# Patient Record
Sex: Female | Born: 1965
Health system: Southern US, Community
[De-identification: ages and names within clinical notes are randomized; demographics above are authoritative.]

## PROBLEM LIST (undated history)

## (undated) DIAGNOSIS — R519 Headache, unspecified: Secondary | ICD-10-CM

## (undated) DIAGNOSIS — M1990 Unspecified osteoarthritis, unspecified site: Secondary | ICD-10-CM

## (undated) DIAGNOSIS — F419 Anxiety disorder, unspecified: Secondary | ICD-10-CM

## (undated) DIAGNOSIS — R42 Dizziness and giddiness: Secondary | ICD-10-CM

## (undated) DIAGNOSIS — Z9109 Other allergy status, other than to drugs and biological substances: Secondary | ICD-10-CM

## (undated) DIAGNOSIS — M542 Cervicalgia: Secondary | ICD-10-CM

## (undated) DIAGNOSIS — N200 Calculus of kidney: Secondary | ICD-10-CM

## (undated) DIAGNOSIS — R51 Headache: Secondary | ICD-10-CM

## (undated) DIAGNOSIS — Z87442 Personal history of urinary calculi: Secondary | ICD-10-CM

## (undated) DIAGNOSIS — R011 Cardiac murmur, unspecified: Secondary | ICD-10-CM

## (undated) DIAGNOSIS — R7303 Prediabetes: Secondary | ICD-10-CM

## (undated) HISTORY — DX: Calculus of kidney: N20.0

## (undated) HISTORY — DX: Unspecified osteoarthritis, unspecified site: M19.90

## (undated) HISTORY — DX: Anxiety disorder, unspecified: F41.9

## (undated) HISTORY — PX: OTHER SURGICAL HISTORY: SHX169

## (undated) HISTORY — DX: Other allergy status, other than to drugs and biological substances: Z91.09

---

## 2004-03-18 ENCOUNTER — Ambulatory Visit: Payer: Self-pay | Admitting: Obstetrics and Gynecology

## 2004-05-24 ENCOUNTER — Ambulatory Visit: Payer: Self-pay | Admitting: Obstetrics and Gynecology

## 2005-09-26 ENCOUNTER — Ambulatory Visit: Payer: Self-pay | Admitting: Obstetrics and Gynecology

## 2005-10-11 ENCOUNTER — Ambulatory Visit: Payer: Self-pay | Admitting: Obstetrics and Gynecology

## 2006-04-13 ENCOUNTER — Ambulatory Visit: Payer: Self-pay

## 2007-03-12 ENCOUNTER — Ambulatory Visit: Payer: Self-pay | Admitting: Obstetrics and Gynecology

## 2008-04-14 ENCOUNTER — Ambulatory Visit: Payer: Self-pay | Admitting: Obstetrics and Gynecology

## 2009-04-23 ENCOUNTER — Ambulatory Visit: Payer: Self-pay | Admitting: Obstetrics and Gynecology

## 2010-04-28 ENCOUNTER — Ambulatory Visit: Payer: Self-pay | Admitting: Obstetrics and Gynecology

## 2011-03-29 ENCOUNTER — Ambulatory Visit: Payer: Self-pay

## 2011-06-06 ENCOUNTER — Ambulatory Visit: Payer: Self-pay | Admitting: Obstetrics and Gynecology

## 2012-06-11 ENCOUNTER — Ambulatory Visit: Payer: Self-pay | Admitting: Obstetrics and Gynecology

## 2013-06-20 ENCOUNTER — Ambulatory Visit: Payer: Self-pay | Admitting: Obstetrics and Gynecology

## 2014-09-03 LAB — HM PAP SMEAR

## 2014-09-14 ENCOUNTER — Other Ambulatory Visit: Payer: Self-pay

## 2014-09-14 DIAGNOSIS — Z1231 Encounter for screening mammogram for malignant neoplasm of breast: Secondary | ICD-10-CM

## 2014-10-01 ENCOUNTER — Ambulatory Visit
Admission: RE | Admit: 2014-10-01 | Discharge: 2014-10-01 | Disposition: A | Discharge status: Home / Self Care | Payer: 59 | Source: Ambulatory Visit | Attending: Obstetrics and Gynecology | Admitting: Obstetrics and Gynecology

## 2014-10-01 DIAGNOSIS — Z1231 Encounter for screening mammogram for malignant neoplasm of breast: Secondary | ICD-10-CM | POA: Diagnosis present

## 2015-05-04 DIAGNOSIS — G43909 Migraine, unspecified, not intractable, without status migrainosus: Secondary | ICD-10-CM

## 2015-05-16 DIAGNOSIS — R42 Dizziness and giddiness: Secondary | ICD-10-CM

## 2015-05-16 HISTORY — DX: Dizziness and giddiness: R42

## 2015-05-24 ENCOUNTER — Encounter: Payer: Self-pay | Admitting: Unknown Physician Specialty

## 2015-06-11 ENCOUNTER — Encounter: Payer: Self-pay | Admitting: Unknown Physician Specialty

## 2015-08-05 ENCOUNTER — Other Ambulatory Visit: Payer: Self-pay | Admitting: Obstetrics and Gynecology

## 2015-08-06 ENCOUNTER — Other Ambulatory Visit: Payer: Self-pay | Admitting: Obstetrics and Gynecology

## 2015-09-07 ENCOUNTER — Encounter: Payer: Self-pay | Admitting: Obstetrics and Gynecology

## 2015-09-07 ENCOUNTER — Ambulatory Visit (INDEPENDENT_AMBULATORY_CARE_PROVIDER_SITE_OTHER): Payer: 59 | Admitting: Obstetrics and Gynecology

## 2015-09-07 VITALS — BP 112/71 | HR 71 | Ht 66.6 in | Wt 166.4 lb

## 2015-09-07 DIAGNOSIS — Z0000 Encounter for general adult medical examination without abnormal findings: Secondary | ICD-10-CM | POA: Diagnosis not present

## 2015-09-07 DIAGNOSIS — Z1211 Encounter for screening for malignant neoplasm of colon: Secondary | ICD-10-CM | POA: Diagnosis not present

## 2015-09-07 DIAGNOSIS — Z803 Family history of malignant neoplasm of breast: Secondary | ICD-10-CM | POA: Diagnosis not present

## 2015-09-07 DIAGNOSIS — Z01419 Encounter for gynecological examination (general) (routine) without abnormal findings: Secondary | ICD-10-CM

## 2015-09-07 DIAGNOSIS — Z87440 Personal history of urinary (tract) infections: Secondary | ICD-10-CM

## 2015-09-07 MED ORDER — ESTRADIOL 0.1 MG/GM VA CREA
1.0000 | TOPICAL_CREAM | VAGINAL | Status: DC
Start: 2015-09-07 — End: 2016-09-12

## 2015-09-07 NOTE — Progress Notes (Signed)
GYN ANNUAL PREVENTATIVE CARE ENCOUNTER NOTE  Subjective:       Sydney Horne is a 50 y.o. para 49 female here for a routine annual gynecologic exam.   Cycles are still regular on a monthly basis  Gynecologic History No LMP recorded. Contraception: none Last Pap: 09/03/2014 negative/negative Last mammogram: 10/01/2014 BI-RADS 1 Family history of breast cancer: mom and sister (sister-negative BRCA 1/BRCA2) History of TVT 2005 Dr. Amalia Hailey  Obstetric History OB History  No data available    No past medical history on file.  Past Surgical History  Procedure Laterality Date  . Bladder control surgery      Current Outpatient Prescriptions on File Prior to Visit  Medication Sig Dispense Refill  . fluticasone (FLONASE) 50 MCG/ACT nasal spray Place 2 sprays into both nostrils daily.    . nitrofurantoin (MACRODANTIN) 100 MG capsule Take 1 capsule by mouth  nightly 30 capsule 1   No current facility-administered medications on file prior to visit.    Allergies  Allergen Reactions  . Zithromax [Azithromycin] Other (See Comments)    GI upset    Social History   Social History  . Marital Status: Married    Spouse Name: N/A  . Number of Children: N/A  . Years of Education: N/A   Occupational History  . Not on file.   Social History Main Topics  . Smoking status: Never Smoker   . Smokeless tobacco: Not on file  . Alcohol Use: Not on file  . Drug Use: Not on file  . Sexual Activity: Not on file   Other Topics Concern  . Not on file   Social History Narrative    Family History  Problem Relation Age of Onset  . Breast cancer Mother 88  . Cancer Mother     breast  . Breast cancer Sister 14  . Cancer Sister     breast  . Diabetes Sister   . Breast cancer Maternal Aunt   . Breast cancer Paternal Aunt   . Breast cancer Paternal Grandmother   . Cancer Father     prostate    The following portions of the patient's history were reviewed and updated as  appropriate: allergies, current medications, past family history, past medical history, past social history, past surgical history and problem list.  Review of Systems Review of Systems  Constitutional: Negative.   HENT: Negative.   Eyes: Negative.   Respiratory: Negative.   Gastrointestinal: Negative.   Genitourinary: Negative.   Musculoskeletal: Negative.   Skin: Negative.   Neurological: Negative.   Endo/Heme/Allergies: Negative.   Psychiatric/Behavioral: Negative.      Objective:   BP 112/71 mmHg  Pulse 71  Ht 5' 6.6" (1.692 m)  Wt 166 lb 6 oz (75.467 kg)  BMI 26.36 kg/m2  LMP 08/29/2015 (Exact Date) Physical Exam  Constitutional: She is oriented to person, place, and time. She appears well-developed and well-nourished.  HENT:  Head: Normocephalic and atraumatic.  Eyes: Conjunctivae and EOM are normal.  Neck: Normal range of motion. Neck supple. No thyromegaly present.  Cardiovascular: Normal rate and regular rhythm.   No murmur heard. Pulmonary/Chest: Effort normal and breath sounds normal.  Abdominal: Soft. She exhibits no distension and no mass. There is no tenderness.  Genitourinary:  External genitalia-normal BUS-normal Vagina-normal Cervix-normal Uterus-retroverted, mobile, top normal size, posterior nodularity consistent with small fibroids, nontender Adnexa-nonpalpable and nontender Rectovaginal-normal sphincter tone; no rectal masses  Musculoskeletal: Normal range of motion.  Lymphadenopathy:    She has  no cervical adenopathy.  Neurological: She is alert and oriented to person, place, and time.  Skin: Skin is warm and dry.  Psychiatric: She has a normal mood and affect.    Assessment:   Annual gynecologic examination 50 y.o. Contraception: none Normal BMI Family history breast cancer History of recurrent UTI, desires, antibiotic prophylaxis  Plan:  Pap: Pap Co Test Mammogram: Ordered Labs: Primary care to perform Routine preventative health  maintenance measures emphasized: Diet/Weight control, Tobacco Cessation and Alcohol/Drug use Calcium with vitamin D supplementation 1200 mg daily Estrace is refilled Patient is to stop Macrodantin prophylaxis for recurrent UTIs. If UTI recurs, she will call in  To have medication restarted Return to , MD   Note: This dictation was prepared with Dragon dictation along with smaller phrase technology. Any transcriptional errors that result from this process are unintentional.

## 2015-09-07 NOTE — Patient Instructions (Addendum)
1. Pap smear in 2019 2. Mammogram ordered 3. Stool guaiac  cards not given. Cologard ordered 4. Continue with healthy eating and exercise 5. Recommend calcium with vitamin D supplementation 1200 mg a day (600 mg in the morning and 600 mg in evening; Vitamin D 400-800 international units daily) 6. Return in 1 year 7. Estrace cream prescription written

## 2015-09-15 LAB — EPI PROCOLON(R), SEPTIN 9

## 2015-09-16 ENCOUNTER — Telehealth: Payer: Self-pay | Admitting: *Deleted

## 2015-09-16 NOTE — Telephone Encounter (Signed)
Patient called and stated that the nurse called her and she is returning the nurse call. She is requesting a call back . Call back number is [phone]. Thanks

## 2015-09-20 NOTE — Telephone Encounter (Signed)
Spoke with pt see lab note.  

## 2015-10-08 ENCOUNTER — Ambulatory Visit
Admission: RE | Admit: 2015-10-08 | Discharge: 2015-10-08 | Disposition: A | Discharge status: Home / Self Care | Payer: 59 | Source: Ambulatory Visit | Attending: Obstetrics and Gynecology | Admitting: Obstetrics and Gynecology

## 2015-10-08 ENCOUNTER — Other Ambulatory Visit: Payer: Self-pay | Admitting: Obstetrics and Gynecology

## 2015-10-08 DIAGNOSIS — Z1231 Encounter for screening mammogram for malignant neoplasm of breast: Secondary | ICD-10-CM | POA: Insufficient documentation

## 2015-10-08 DIAGNOSIS — Z01419 Encounter for gynecological examination (general) (routine) without abnormal findings: Secondary | ICD-10-CM

## 2016-05-02 ENCOUNTER — Encounter: Payer: Self-pay | Admitting: Family Medicine

## 2016-05-02 ENCOUNTER — Ambulatory Visit (INDEPENDENT_AMBULATORY_CARE_PROVIDER_SITE_OTHER): Payer: 59 | Admitting: Family Medicine

## 2016-05-02 VITALS — BP 107/70 | HR 65 | Temp 98.5°F | Wt 178.0 lb

## 2016-05-02 DIAGNOSIS — M545 Low back pain, unspecified: Secondary | ICD-10-CM

## 2016-05-02 LAB — UA/M W/RFLX CULTURE, ROUTINE
Bilirubin, UA: NEGATIVE
Glucose, UA: NEGATIVE
Ketones, UA: NEGATIVE
LEUKOCYTES UA: NEGATIVE
NITRITE UA: NEGATIVE
PH UA: 7 (ref 5.0–7.5)
PROTEIN UA: NEGATIVE
RBC UA: NEGATIVE
Specific Gravity, UA: 1.02 (ref 1.005–1.030)
Urobilinogen, Ur: 0.2 mg/dL (ref 0.2–1.0)

## 2016-05-02 MED ORDER — TAMSULOSIN HCL 0.4 MG PO CAPS: 0.4000 mg | ORAL_CAPSULE | Freq: Every day | ORAL | 1 refills | Status: DC

## 2016-05-02 NOTE — Patient Instructions (Signed)
Follow up as needed

## 2016-05-02 NOTE — Progress Notes (Signed)
BP 107/70 (BP Location: Left Arm, Patient Position: Sitting, Cuff Size: Large)   Pulse 65   Temp 98.5 F (36.9 C)   Wt 178 lb (80.7 kg)   LMP 03/26/2016 (Approximate)   SpO2 99%   BMI 28.21 kg/m    Subjective:    Patient ID: Sydney Horne, female    DOB: [birth year], 50 y.o.   MRN: [id number]  HPI: Sydney Horne is a 50 y.o. female  Chief Complaint  Patient presents with  . Nephrolithiasis    pt states she had low back pain that started Wednesday night and states that the pain got very intense and then subsided in about an hour    Patient presents with left sided mid back pain that started late Wednesday and intensified to severe for about an hour and a half before slowly subsiding. States the pain was dull but became sharp, causing nausea and nearly vomiting. Now feeling almost back to normal the past day or so. Using a heat pad. Went to see chiropractor to see if it was a musculoskeletal issue and everything came back normal. No urinary symptoms, bowels moving regularly, no fever, diarrhea, bloody stools.   Relevant past medical, surgical, family and social history reviewed and updated as indicated. Interim medical history since our last visit reviewed. Allergies and medications reviewed and updated.  Review of Systems  Constitutional: Negative.   HENT: Negative.   Respiratory: Negative.   Cardiovascular: Negative.   Gastrointestinal: Positive for nausea.  Genitourinary: Positive for flank pain.  Musculoskeletal: Positive for back pain.  Neurological: Negative.   Psychiatric/Behavioral: Negative.     Per HPI unless specifically indicated above     Objective:    BP 107/70 (BP Location: Left Arm, Patient Position: Sitting, Cuff Size: Large)   Pulse 65   Temp 98.5 F (36.9 C)   Wt 178 lb (80.7 kg)   LMP 03/26/2016 (Approximate)   SpO2 99%   BMI 28.21 kg/m   Wt Readings from Last 3 Encounters:  05/02/16 178 lb (80.7 kg)  09/07/15 166 lb 6 oz (75.5 kg)  01/05/14  172 lb (78 kg)    Physical Exam  Constitutional: She is oriented to person, place, and time. She appears well-developed and well-nourished.  HENT:  Head: Atraumatic.  Eyes: Conjunctivae are normal. Pupils are equal, round, and reactive to light. No scleral icterus.  Neck: Normal range of motion. Neck supple.  Cardiovascular: Normal rate, regular rhythm and normal heart sounds.   Pulmonary/Chest: Effort normal and breath sounds normal.  Abdominal: Soft. Bowel sounds are normal. She exhibits no distension. There is no tenderness.  Musculoskeletal: Normal range of motion.  No CVA tenderness  Neurological: She is alert and oriented to person, place, and time.  Skin: Skin is warm and dry.  Psychiatric: She has a normal mood and affect. Her behavior is normal.  Nursing note and vitals reviewed.     Assessment & Plan:   Problem List Items Addressed This Visit    None    Visit Diagnoses    Acute left-sided low back pain without sciatica    -  Primary   High suspicion for a passed kidney stone, will send flomax to take the next week or so and then if any recurrence happens. Tylenol or ibuprofen, lots of water.    Relevant Orders   UA/M w/rflx Culture, Routine    U/A WNL, discussed return precautions and management if recurrence of sxs.    Follow up plan:  Return if symptoms worsen or fail to improve.

## 2016-09-11 NOTE — Progress Notes (Signed)
GYN ANNUAL PREVENTATIVE CARE ENCOUNTER NOTE  Subjective:       Sydney Horne is a 51 y.o. para 7 female here for a routine annual gynecologic exam.  1. Climacteric  Sydney Horne is here for an annual exam with no acute concerns. She is still having regular cycles, LMP 4/15. States that she is having some vasomotor symptoms mostly at night where she is getting hot and having to kick off the covers. She is not currently using any form of contraception and has no desire for any. Denies fever, chills, fatigue, urinary or bowel changes. Does have some dependent leg swelling that resolves in the morning. States that her sister has just been diagnosed with colon cancer and is now ready to have a colonoscopy.      Gynecologic History LMP:08/27/2016 Contraception: none Last Pap: 09/03/2014 negative/negative Last mammogram:10/08/2015 birad 1 Family history of breast cancer: mom and sister (sister-negative BRCA 1/BRCA2) History of TVT 2005 Dr. Amalia Hailey  Obstetric History OB History  No data available    No past medical history on file.  Past Surgical History:  Procedure Laterality Date  . bladder control surgery      Current Outpatient Prescriptions on File Prior to Visit  Medication Sig Dispense Refill  . calcium citrate-vitamin D (CITRACAL+D) 315-200 MG-UNIT tablet Take 1 tablet by mouth daily.    . Cranberry (THERACRAN ONE PO) Take 1 tablet by mouth daily.    Marland Kitchen estradiol (ESTRACE VAGINAL) 0.1 MG/GM vaginal cream Place 1 Applicatorful vaginally 2 (two) times a week. 42.5 g 3  . fluticasone (FLONASE) 50 MCG/ACT nasal spray Place 2 sprays into both nostrils as needed.     . Multiple Vitamin (MULTIVITAMIN) tablet Take 1 tablet by mouth daily.    . tamsulosin (FLOMAX) 0.4 MG CAPS capsule Take 1 capsule (0.4 mg total) by mouth daily. 30 capsule 1   No current facility-administered medications on file prior to visit.     Allergies  Allergen Reactions  . Zithromax [Azithromycin] Other (See  Comments)    GI upset    Social History   Social History  . Marital status: Married    Spouse name: N/A  . Number of children: N/A  . Years of education: N/A   Occupational History  . Not on file.   Social History Main Topics  . Smoking status: Never Smoker  . Smokeless tobacco: Never Used  . Alcohol use No  . Drug use: No  . Sexual activity: Not on file   Other Topics Concern  . Not on file   Social History Narrative  . No narrative on file    Family History  Problem Relation Age of Onset  . Breast cancer Mother 23  . Cancer Mother     breast  . Cancer Father     prostate  . Breast cancer Sister 54  . Cancer Sister     breast  . Diabetes Sister   . Breast cancer Maternal Aunt   . Breast cancer Paternal Aunt   . Breast cancer Paternal Grandmother     The following portions of the patient's history were reviewed and updated as appropriate: allergies, current medications, past family history, past medical history, past social history, past surgical history and problem list.  Review of Systems Review of Systems  Constitutional: Negative.   HENT: Negative.   Eyes: Negative.   Respiratory: Negative.   Cardiovascular: Positive for leg swelling. Negative for chest pain, palpitations and claudication.  Gastrointestinal: Negative.  Genitourinary: Negative.   Musculoskeletal: Negative.   Skin: Negative.   Neurological: Positive for headaches. Negative for dizziness and focal weakness.  Endo/Heme/Allergies: Negative.      Objective:  BP 104/70   Pulse 99   Ht 5' 6.6" (1.692 m)   Wt 177 lb 4.8 oz (80.4 kg)   LMP 08/27/2016 (Exact Date)   BMI 28.10 kg/m   Physical Exam  Constitutional: She is oriented to person, place, and time. She appears well-developed and well-nourished.  HENT:  Head: Normocephalic and atraumatic.  Eyes: Conjunctivae and EOM are normal.  Neck: Normal range of motion. Neck supple. No thyromegaly present.  Cardiovascular: Normal rate  and regular rhythm.   No murmur heard. Pulmonary/Chest: Effort normal and breath sounds normal.  Abdominal: Soft. She exhibits no distension and no mass. There is no tenderness. There is no guarding.  Genitourinary:  Genitourinary Comments: External genitalia-normal BUS-normal Vagina-normal Cervix-normal Uterus-retroverted, mobile, nontender Adnexa-nonpalpable and nontender Rectovaginal-normal sphincter tone; no rectal masses  Musculoskeletal: Normal range of motion.  Lymphadenopathy:    She has no cervical adenopathy.  Neurological: She is alert and oriented to person, place, and time.  Skin: Skin is warm and dry.  Psychiatric: She has a normal mood and affect.  Breast: Bilateral fibrocystic changes, no masses not nodules, no axillary adenopathy  Assessment:   Annual gynecologic examination 51 y.o. Contraception: none  Climacteric Normal BMI-28 Family history breast cancer History of recurrent UTI  Plan:  Pap: due 2019 Mammogram: Ordered  Colonoscopy ordered Labs: Primary care to perform Routine preventative health maintenance measures emphasized: Diet/Weight control, Tobacco Cessation and Alcohol/Drug use Calcium with vitamin D supplementation 1200 mg daily Estrace is refilled Return to [place], CMA  Aliene Altes, PA-S Brayton Mars, MD   I have seen, interviewed, and examined the patient in conjunction with the [email or web address]. student and affirm the diagnosis and management plan. Declynn Lopresti A. Treyshawn Muldrew, MD, FACOG   Note: This dictation was prepared with Dragon dictation along with smaller phrase technology. Any transcriptional errors that result from this process are unintentional.

## 2016-09-12 ENCOUNTER — Encounter: Payer: Self-pay | Admitting: Obstetrics and Gynecology

## 2016-09-12 ENCOUNTER — Ambulatory Visit (INDEPENDENT_AMBULATORY_CARE_PROVIDER_SITE_OTHER): Payer: 59 | Admitting: Obstetrics and Gynecology

## 2016-09-12 VITALS — BP 104/70 | HR 99 | Ht 66.6 in | Wt 177.3 lb

## 2016-09-12 DIAGNOSIS — Z803 Family history of malignant neoplasm of breast: Secondary | ICD-10-CM | POA: Diagnosis not present

## 2016-09-12 DIAGNOSIS — N951 Menopausal and female climacteric states: Secondary | ICD-10-CM

## 2016-09-12 DIAGNOSIS — Z01419 Encounter for gynecological examination (general) (routine) without abnormal findings: Secondary | ICD-10-CM | POA: Diagnosis not present

## 2016-09-12 DIAGNOSIS — Z1211 Encounter for screening for malignant neoplasm of colon: Secondary | ICD-10-CM | POA: Diagnosis not present

## 2016-09-12 DIAGNOSIS — Z800 Family history of malignant neoplasm of digestive organs: Secondary | ICD-10-CM | POA: Diagnosis not present

## 2016-09-12 MED ORDER — ESTRADIOL 0.1 MG/GM VA CREA: 1.0000 | TOPICAL_CREAM | VAGINAL | 3 refills | Status: DC

## 2016-09-12 NOTE — Addendum Note (Signed)
Addended by: Elouise Munroe on: 09/12/2016 04:56 PM   Modules accepted: Orders

## 2016-09-12 NOTE — Patient Instructions (Signed)
Health Maintenance, Female Adopting a healthy lifestyle and getting preventive care can go a long way to promote health and wellness. Talk with your health care provider about what schedule of regular examinations is right for you. This is a good chance for you to check in with your provider about disease prevention and staying healthy. In between checkups, there are plenty of things you can do on your own. Experts have done a lot of research about which lifestyle changes and preventive measures are most likely to keep you healthy. Ask your health care provider for more information. Weight and diet Eat a healthy diet  Be sure to include plenty of vegetables, fruits, low-fat dairy products, and lean protein.  Do not eat a lot of foods high in solid fats, added sugars, or salt.  Get regular exercise. This is one of the most important things you can do for your health.  Most adults should exercise for at least 150 minutes each week. The exercise should increase your heart rate and make you sweat (moderate-intensity exercise).  Most adults should also do strengthening exercises at least twice a week. This is in addition to the moderate-intensity exercise. Maintain a healthy weight  Body mass index (BMI) is a measurement that can be used to identify possible weight problems. It estimates body fat based on height and weight. Your health care provider can help determine your BMI and help you achieve or maintain a healthy weight.  For females 76 years of age and older:  A BMI below 18.5 is considered underweight.  A BMI of 18.5 to 24.9 is normal.  A BMI of 25 to 29.9 is considered overweight.  A BMI of 30 and above is considered obese. Watch levels of cholesterol and blood lipids  You should start having your blood tested for lipids and cholesterol at 52 years of age, then have this test every 5 years.  You may need to have your cholesterol levels checked more often if:  Your lipid or  cholesterol levels are high.  You are older than 52 years of age.  You are at high risk for heart disease. Cancer screening Lung Cancer  Lung cancer screening is recommended for adults 64-42 years old who are at high risk for lung cancer because of a history of smoking.  A yearly low-dose CT scan of the lungs is recommended for people who:  Currently smoke.  Have quit within the past 15 years.  Have at least a 30-pack-year history of smoking. A pack year is smoking an average of one pack of cigarettes a day for 1 year.  Yearly screening should continue until it has been 15 years since you quit.  Yearly screening should stop if you develop a health problem that would prevent you from having lung cancer treatment. Breast Cancer  Practice breast self-awareness. This means understanding how your breasts normally appear and feel.  It also means doing regular breast self-exams. Let your health care provider know about any changes, no matter how small.  If you are in your 20s or 30s, you should have a clinical breast exam (CBE) by a health care provider every 1-3 years as part of a regular health exam.  If you are 34 or older, have a CBE every year. Also consider having a breast X-ray (mammogram) every year.  If you have a family history of breast cancer, talk to your health care provider about genetic screening.  If you are at high risk for breast cancer, talk  to your health care provider about having an MRI and a mammogram every year.  Breast cancer gene (BRCA) assessment is recommended for women who have family members with BRCA-related cancers. BRCA-related cancers include:  Breast.  Ovarian.  Tubal.  Peritoneal cancers.  Results of the assessment will determine the need for genetic counseling and BRCA1 and BRCA2 testing. Cervical Cancer  Your health care provider may recommend that you be screened regularly for cancer of the pelvic organs (ovaries, uterus, and vagina).  This screening involves a pelvic examination, including checking for microscopic changes to the surface of your cervix (Pap test). You may be encouraged to have this screening done every 3 years, beginning at age 24.  For women ages 66-65, health care providers may recommend pelvic exams and Pap testing every 3 years, or they may recommend the Pap and pelvic exam, combined with testing for human papilloma virus (HPV), every 5 years. Some types of HPV increase your risk of cervical cancer. Testing for HPV may also be done on women of any age with unclear Pap test results.  Other health care providers may not recommend any screening for nonpregnant women who are considered low risk for pelvic cancer and who do not have symptoms. Ask your health care provider if a screening pelvic exam is right for you.  If you have had past treatment for cervical cancer or a condition that could lead to cancer, you need Pap tests and screening for cancer for at least 20 years after your treatment. If Pap tests have been discontinued, your risk factors (such as having a new sexual partner) need to be reassessed to determine if screening should resume. Some women have medical problems that increase the chance of getting cervical cancer. In these cases, your health care provider may recommend more frequent screening and Pap tests. Colorectal Cancer  This type of cancer can be detected and often prevented.  Routine colorectal cancer screening usually begins at 52 years of age and continues through 52 years of age.  Your health care provider may recommend screening at an earlier age if you have risk factors for colon cancer.  Your health care provider may also recommend using home test kits to check for hidden blood in the stool.  A small camera at the end of a tube can be used to examine your colon directly (sigmoidoscopy or colonoscopy). This is done to check for the earliest forms of colorectal cancer.  Routine  screening usually begins at age 41.  Direct examination of the colon should be repeated every 5-10 years through 52 years of age. However, you may need to be screened more often if early forms of precancerous polyps or small growths are found. Skin Cancer  Check your skin from head to toe regularly.  Tell your health care provider about any new moles or changes in moles, especially if there is a change in a mole's shape or color.  Also tell your health care provider if you have a mole that is larger than the size of a pencil eraser.  Always use sunscreen. Apply sunscreen liberally and repeatedly throughout the day.  Protect yourself by wearing long sleeves, pants, a wide-brimmed hat, and sunglasses whenever you are outside. Heart disease, diabetes, and high blood pressure  High blood pressure causes heart disease and increases the risk of stroke. High blood pressure is more likely to develop in:  People who have blood pressure in the high end of the normal range (130-139/85-89 mm Hg).  People who are overweight or obese.  People who are African American.  If you are 59-24 years of age, have your blood pressure checked every 3-5 years. If you are 34 years of age or older, have your blood pressure checked every year. You should have your blood pressure measured twice-once when you are at a hospital or clinic, and once when you are not at a hospital or clinic. Record the average of the two measurements. To check your blood pressure when you are not at a hospital or clinic, you can use:  An automated blood pressure machine at a pharmacy.  A home blood pressure monitor.  If you are between 29 years and 60 years old, ask your health care provider if you should take aspirin to prevent strokes.  Have regular diabetes screenings. This involves taking a blood sample to check your fasting blood sugar level.  If you are at a normal weight and have a low risk for diabetes, have this test once  every three years after 52 years of age.  If you are overweight and have a high risk for diabetes, consider being tested at a younger age or more often. Preventing infection Hepatitis B  If you have a higher risk for hepatitis B, you should be screened for this virus. You are considered at high risk for hepatitis B if:  You were born in a country where hepatitis B is common. Ask your health care provider which countries are considered high risk.  Your parents were born in a high-risk country, and you have not been immunized against hepatitis B (hepatitis B vaccine).  You have HIV or AIDS.  You use needles to inject street drugs.  You live with someone who has hepatitis B.  You have had sex with someone who has hepatitis B.  You get hemodialysis treatment.  You take certain medicines for conditions, including cancer, organ transplantation, and autoimmune conditions. Hepatitis C  Blood testing is recommended for:  Everyone born from [birth year] through 1966.  Anyone with known risk factors for hepatitis C. Sexually transmitted infections (STIs)  You should be screened for sexually transmitted infections (STIs) including gonorrhea and chlamydia if:  You are sexually active and are younger than 52 years of age.  You are older than 52 years of age and your health care provider tells you that you are at risk for this type of infection.  Your sexual activity has changed since you were last screened and you are at an increased risk for chlamydia or gonorrhea. Ask your health care provider if you are at risk.  If you do not have HIV, but are at risk, it may be recommended that you take a prescription medicine daily to prevent HIV infection. This is called pre-exposure prophylaxis (PrEP). You are considered at risk if:  You are sexually active and do not regularly use condoms or know the HIV status of your partner(s).  You take drugs by injection.  You are sexually active with a partner  who has HIV. Talk with your health care provider about whether you are at high risk of being infected with HIV. If you choose to begin PrEP, you should first be tested for HIV. You should then be tested every 3 months for as long as you are taking PrEP. Pregnancy  If you are premenopausal and you may become pregnant, ask your health care provider about preconception counseling.  If you may become pregnant, take 400 to 800 micrograms (mcg) of folic acid  every day.  If you want to prevent pregnancy, talk to your health care provider about birth control (contraception). Osteoporosis and menopause  Osteoporosis is a disease in which the bones lose minerals and strength with aging. This can result in serious bone fractures. Your risk for osteoporosis can be identified using a bone density scan.  If you are 4 years of age or older, or if you are at risk for osteoporosis and fractures, ask your health care provider if you should be screened.  Ask your health care provider whether you should take a calcium or vitamin D supplement to lower your risk for osteoporosis.  Menopause may have certain physical symptoms and risks.  Hormone replacement therapy may reduce some of these symptoms and risks. Talk to your health care provider about whether hormone replacement therapy is right for you. Follow these instructions at home:  Schedule regular health, dental, and eye exams.  Stay current with your immunizations.  Do not use any tobacco products including cigarettes, chewing tobacco, or electronic cigarettes.  If you are pregnant, do not drink alcohol.  If you are breastfeeding, limit how much and how often you drink alcohol.  Limit alcohol intake to no more than 1 drink per day for nonpregnant women. One drink equals 12 ounces of beer, 5 ounces of wine, or 1 ounces of hard liquor.  Do not use street drugs.  Do not share needles.  Ask your health care provider for help if you need support  or information about quitting drugs.  Tell your health care provider if you often feel depressed.  Tell your health care provider if you have ever been abused or do not feel safe at home. This information is not intended to replace advice given to you by your health care provider. Make sure you discuss any questions you have with your health care provider. Document Released: 11/14/2010 Document Revised: 10/07/2015 Document Reviewed: 02/02/2015 Elsevier Interactive Patient Education  2017 [employer].

## 2016-09-15 ENCOUNTER — Telehealth: Payer: Self-pay | Admitting: Obstetrics and Gynecology

## 2016-09-15 NOTE — Telephone Encounter (Signed)
Can you please enter a referral for patient for screening colonoscopy.

## 2016-09-18 NOTE — Addendum Note (Signed)
Addended by: Elouise Munroe on: 09/18/2016 09:44 AM   Modules accepted: Orders

## 2016-09-18 NOTE — Telephone Encounter (Signed)
Pt aware per vm referral placed.

## 2016-10-11 ENCOUNTER — Ambulatory Visit
Admission: RE | Admit: 2016-10-11 | Discharge: 2016-10-11 | Disposition: A | Discharge status: Home / Self Care | Payer: 59 | Source: Ambulatory Visit | Attending: Obstetrics and Gynecology | Admitting: Obstetrics and Gynecology

## 2016-10-11 DIAGNOSIS — Z01419 Encounter for gynecological examination (general) (routine) without abnormal findings: Secondary | ICD-10-CM

## 2016-10-11 DIAGNOSIS — Z1231 Encounter for screening mammogram for malignant neoplasm of breast: Secondary | ICD-10-CM | POA: Diagnosis not present

## 2016-10-13 ENCOUNTER — Other Ambulatory Visit: Payer: Self-pay

## 2016-10-13 ENCOUNTER — Other Ambulatory Visit: Payer: Self-pay | Admitting: Obstetrics and Gynecology

## 2016-10-13 ENCOUNTER — Telehealth: Payer: Self-pay | Admitting: Gastroenterology

## 2016-10-13 DIAGNOSIS — R928 Other abnormal and inconclusive findings on diagnostic imaging of breast: Secondary | ICD-10-CM

## 2016-10-13 DIAGNOSIS — N6489 Other specified disorders of breast: Secondary | ICD-10-CM

## 2016-10-13 DIAGNOSIS — Z1211 Encounter for screening for malignant neoplasm of colon: Secondary | ICD-10-CM

## 2016-10-13 NOTE — Telephone Encounter (Signed)
Gastroenterology Pre-Procedure Review  Request Date: 11/27/16 Requesting Physician: Dr. Allen Norris  PATIENT REVIEW QUESTIONS: The patient responded to the following health history questions as indicated:    1. Are you having any GI issues? no 2. Do you have a personal history of Polyps? no 3. Do you have a family history of Colon Cancer or Polyps? yes (sister polyps) 4. Diabetes Mellitus? no 5. Joint replacements in the past 12 months?no 6. Major health problems in the past 3 months?no 7. Any artificial heart valves, MVP, or defibrillator?no    MEDICATIONS & ALLERGIES:    Patient reports the following regarding taking any anticoagulation/antiplatelet therapy:   Plavix, Coumadin, Eliquis, Xarelto, Lovenox, Pradaxa, Brilinta, or Effient? no Aspirin? no  Patient confirms/reports the following medications:  Current Outpatient Prescriptions  Medication Sig Dispense Refill  . calcium citrate-vitamin D (CITRACAL+D) 315-200 MG-UNIT tablet Take 1 tablet by mouth daily.    . Cranberry (THERACRAN ONE PO) Take 1 tablet by mouth daily.    Marland Kitchen estradiol (ESTRACE VAGINAL) 0.1 MG/GM vaginal cream Place 1 Applicatorful vaginally 2 (two) times a week. 42.5 g 3  . fluticasone (FLONASE) 50 MCG/ACT nasal spray Place 2 sprays into both nostrils as needed.     . Multiple Vitamin (MULTIVITAMIN) tablet Take 1 tablet by mouth daily.    . tamsulosin (FLOMAX) 0.4 MG CAPS capsule Take 1 capsule (0.4 mg total) by mouth daily. 30 capsule 1   No current facility-administered medications for this visit.     Patient confirms/reports the following allergies:  Allergies  Allergen Reactions  . Zithromax [Azithromycin] Other (See Comments)    GI upset    No orders of the defined types were placed in this encounter.   AUTHORIZATION INFORMATION Primary Insurance: 1D#: Group #:  Secondary Insurance: 1D#: Group #:  SCHEDULE INFORMATION: Date: 11/27/16 Time: Location:[facility]

## 2016-10-13 NOTE — Telephone Encounter (Signed)
colonoscopy

## 2016-10-24 ENCOUNTER — Ambulatory Visit
Admission: RE | Admit: 2016-10-24 | Discharge: 2016-10-24 | Disposition: A | Discharge status: Home / Self Care | Payer: 59 | Source: Ambulatory Visit | Attending: Obstetrics and Gynecology | Admitting: Obstetrics and Gynecology

## 2016-10-24 DIAGNOSIS — N6002 Solitary cyst of left breast: Secondary | ICD-10-CM | POA: Insufficient documentation

## 2016-10-24 DIAGNOSIS — N6489 Other specified disorders of breast: Secondary | ICD-10-CM

## 2016-10-24 DIAGNOSIS — R922 Inconclusive mammogram: Secondary | ICD-10-CM | POA: Diagnosis not present

## 2016-10-24 DIAGNOSIS — R928 Other abnormal and inconclusive findings on diagnostic imaging of breast: Secondary | ICD-10-CM | POA: Diagnosis not present

## 2016-11-21 ENCOUNTER — Encounter: Payer: Self-pay | Admitting: *Deleted

## 2016-11-27 ENCOUNTER — Ambulatory Visit: Payer: 59 | Admitting: Anesthesiology

## 2016-11-27 ENCOUNTER — Ambulatory Visit
Admission: RE | Admit: 2016-11-27 | Discharge: 2016-11-27 | Disposition: A | Discharge status: Home / Self Care | Payer: 59 | Source: Ambulatory Visit | Attending: Gastroenterology | Admitting: Gastroenterology

## 2016-11-27 ENCOUNTER — Encounter
Admission: RE | Disposition: A | Discharge status: Home / Self Care | Payer: Self-pay | Source: Ambulatory Visit | Attending: Gastroenterology

## 2016-11-27 DIAGNOSIS — Z7952 Long term (current) use of systemic steroids: Secondary | ICD-10-CM | POA: Insufficient documentation

## 2016-11-27 DIAGNOSIS — Z7983 Long term (current) use of bisphosphonates: Secondary | ICD-10-CM | POA: Insufficient documentation

## 2016-11-27 DIAGNOSIS — Z1211 Encounter for screening for malignant neoplasm of colon: Secondary | ICD-10-CM | POA: Diagnosis not present

## 2016-11-27 DIAGNOSIS — Z79899 Other long term (current) drug therapy: Secondary | ICD-10-CM | POA: Insufficient documentation

## 2016-11-27 HISTORY — DX: Headache: R51

## 2016-11-27 HISTORY — DX: Cardiac murmur, unspecified: R01.1

## 2016-11-27 HISTORY — DX: Dizziness and giddiness: R42

## 2016-11-27 HISTORY — PX: COLONOSCOPY WITH PROPOFOL: SHX5780

## 2016-11-27 HISTORY — DX: Headache, unspecified: R51.9

## 2016-11-27 HISTORY — DX: Cervicalgia: M54.2

## 2016-11-27 SURGERY — COLONOSCOPY WITH PROPOFOL
Anesthesia: General

## 2016-11-27 MED ORDER — ACETAMINOPHEN 325 MG PO TABS: 325.0000 mg | ORAL_TABLET | ORAL | Status: DC | PRN

## 2016-11-27 MED ORDER — PROPOFOL 10 MG/ML IV BOLUS
INTRAVENOUS | Status: DC | PRN
  Administered 2016-11-27: 40 mg via INTRAVENOUS
  Administered 2016-11-27: 30 mg via INTRAVENOUS
  Administered 2016-11-27: 40 mg via INTRAVENOUS
  Administered 2016-11-27: 20 mg via INTRAVENOUS
  Administered 2016-11-27: 60 mg via INTRAVENOUS

## 2016-11-27 MED ORDER — ACETAMINOPHEN 160 MG/5ML PO SOLN: 325.0000 mg | ORAL | Status: DC | PRN

## 2016-11-27 MED ORDER — SIMETHICONE 40 MG/0.6ML PO SUSP
ORAL | Status: DC | PRN
  Administered 2016-11-27: 08:00:00

## 2016-11-27 MED ORDER — LACTATED RINGERS IV SOLN
INTRAVENOUS | Status: DC
  Administered 2016-11-27: 08:00:00 via INTRAVENOUS

## 2016-11-27 SURGICAL SUPPLY — 23 items

## 2016-11-27 NOTE — Anesthesia Procedure Notes (Signed)
Performed by: Melonie Florida A Pre-anesthesia Checklist: Patient identified, Patient being monitored, Emergency Drugs available, Timeout performed and Suction available Patient Re-evaluated:Patient Re-evaluated prior to induction Oxygen Delivery Method: Nasal cannula Preoxygenation: Pre-oxygenation with 100% oxygen Induction Type: IV induction Dental Injury: Teeth and Oropharynx as per pre-operative assessment

## 2016-11-27 NOTE — H&P (Signed)
Sydney Lame, MD [facility] [address], [place], [place] [address] Phone: [phone] Fax : [phone]  Primary Care Physician:  Kathrine Haddock, NP Primary Gastroenterologist:  Dr. Allen Norris  Pre-Procedure History & Physical: HPI:  Sydney Horne is a 51 y.o. female is here for a screening colonoscopy.   Past Medical History:  Diagnosis Date  . Environmental allergies   . Headache    2x/mo  . Heart murmur    followed by PCP  . Kidney stones   . Neck pain    sees chiropractor for adjustment when having problems  . Vertigo 2017   2 episodes    Past Surgical History:  Procedure Laterality Date  . bladder control surgery      Prior to Admission medications   Medication Sig Start Date End Date Taking? Authorizing Provider  calcium citrate-vitamin D (CITRACAL+D) 315-200 MG-UNIT tablet Take 1 tablet by mouth daily.   Yes [provider]  Cranberry (THERACRAN ONE PO) Take 1 tablet by mouth daily.   Yes [provider]  estradiol (ESTRACE VAGINAL) 0.1 MG/GM vaginal cream Place 1 Applicatorful vaginally 2 (two) times a week. 09/14/16  Yes Defrancesco, Alanda Slim, MD  fluticasone (FLONASE) 50 MCG/ACT nasal spray Place 2 sprays into both nostrils as needed.    Yes [provider]  ibuprofen (ADVIL,MOTRIN) 200 MG tablet Take 200 mg by mouth every 6 (six) hours as needed for headache.   Yes [provider]  Multiple Vitamin (MULTIVITAMIN) tablet Take 1 tablet by mouth daily.   Yes [provider]  tamsulosin (FLOMAX) 0.4 MG CAPS capsule Take 1 capsule (0.4 mg total) by mouth daily. Patient not taking: Reported on 11/21/2016 05/02/16   Volney American, PA-C    Allergies as of 10/13/2016 - Review Complete 09/12/2016  Allergen Reaction Noted  . Zithromax [azithromycin] Other (See Comments) 05/04/2015    Family History  Problem Relation Age of Onset  . Breast cancer Mother 62  . Cancer Mother        breast  . Cancer Father    prostate  . Breast cancer Sister 32  . Cancer Sister        breast  . Diabetes Sister   . Colon cancer Sister   . Breast cancer Maternal Aunt   . Breast cancer Paternal Aunt   . Breast cancer Paternal Grandmother   . Ovarian cancer Neg Hx     Social History   Social History  . Marital status: Married    Spouse name: N/A  . Number of children: N/A  . Years of education: N/A   Occupational History  . Not on file.   Social History Main Topics  . Smoking status: Never Smoker  . Smokeless tobacco: Never Used  . Alcohol use 0.0 oz/week     Comment: 1 drink/mo  . Drug use: No  . Sexual activity: Yes    Birth control/ protection: None   Other Topics Concern  . Not on file   Social History Narrative  . No narrative on file    Review of Systems: See HPI, otherwise negative ROS  Physical Exam: BP 118/74   Pulse 73   Temp 97.6 F (36.4 C)   Ht 5' 6.6" (1.692 m)   Wt 176 lb (79.8 kg)   LMP 11/19/2016 (Exact Date)   SpO2 98%   BMI 27.90 kg/m  General:   Alert,  pleasant and cooperative in NAD Head:  Normocephalic and atraumatic. Neck:  Supple;  no masses or thyromegaly. Lungs:  Clear throughout to auscultation.    Heart:  Regular rate and rhythm. Abdomen:  Soft, nontender and nondistended. Normal bowel sounds, without guarding, and without rebound.   Neurologic:  Alert and  oriented x4;  grossly normal neurologically.  Impression/Plan: Sydney Horne is now here to undergo a screening colonoscopy.  Risks, benefits, and alternatives regarding colonoscopy have been reviewed with the patient.  Questions have been answered.  All parties agreeable.

## 2016-11-27 NOTE — Discharge Instructions (Signed)
General Anesthesia, Adult, Care After °These instructions provide you with information about caring for yourself after your procedure. Your health care provider may also give you more specific instructions. Your treatment has been planned according to current medical practices, but problems sometimes occur. Call your health care provider if you have any problems or questions after your procedure. °What can I expect after the procedure? °After the procedure, it is common to have: °· Vomiting. °· A sore throat. °· Mental slowness. ° °It is common to feel: °· Nauseous. °· Cold or shivery. °· Sleepy. °· Tired. °· Sore or achy, even in parts of your body where you did not have surgery. ° °Follow these instructions at home: °For at least 24 hours after the procedure: °· Do not: °? Participate in activities where you could fall or become injured. °? Drive. °? Use heavy machinery. °? Drink alcohol. °? Take sleeping pills or medicines that cause drowsiness. °? Make important decisions or sign legal documents. °? Take care of children on your own. °· Rest. °Eating and drinking °· If you vomit, drink water, juice, or soup when you can drink without vomiting. °· Drink enough fluid to keep your urine clear or pale yellow. °· Make sure you have little or no nausea before eating solid foods. °· Follow the diet recommended by your health care provider. °General instructions °· Have a responsible adult stay with you until you are awake and alert. °· Return to your normal activities as told by your health care provider. Ask your health care provider what activities are safe for you. °· Take over-the-counter and prescription medicines only as told by your health care provider. °· If you smoke, do not smoke without supervision. °· Keep all follow-up visits as told by your health care provider. This is important. °Contact a health care provider if: °· You continue to have nausea or vomiting at home, and medicines are not helpful. °· You  cannot drink fluids or start eating again. °· You cannot urinate after 8-12 hours. °· You develop a skin rash. °· You have fever. °· You have increasing redness at the site of your procedure. °Get help right away if: °· You have difficulty breathing. °· You have chest pain. °· You have unexpected bleeding. °· You feel that you are having a life-threatening or urgent problem. °This information is not intended to replace advice given to you by your health care provider. Make sure you discuss any questions you have with your health care provider. °Document Released: 08/07/2000 Document Revised: 10/04/2015 Document Reviewed: 04/15/2015 °Elsevier Interactive Patient Education © 2018 [employer] Inc[employer] ° °

## 2016-11-27 NOTE — Transfer of Care (Signed)
Immediate Anesthesia Transfer of Care Note  Patient: Sydney Horne  Procedure(s) Performed: Procedure(s): COLONOSCOPY WITH PROPOFOL (N/A)  Patient Location: PACU  Anesthesia Type: General  Level of Consciousness: awake, alert  and patient cooperative  Airway and Oxygen Therapy: Patient Spontanous Breathing and Patient connected to supplemental oxygen  Post-op Assessment: Post-op Vital signs reviewed, Patient's Cardiovascular Status Stable, Respiratory Function Stable, Patent Airway and No signs of Nausea or vomiting  Post-op Vital Signs: Reviewed and stable  Complications: No apparent anesthesia complications

## 2016-11-27 NOTE — Anesthesia Procedure Notes (Signed)
Procedure Name: General with mask airway Performed by: Melonie Florida A Patient Re-evaluated:Patient Re-evaluated prior to induction Oxygen Delivery Method: Simple face mask Preoxygenation: Pre-oxygenation with 100% oxygen Induction Type: IV induction

## 2016-11-27 NOTE — Anesthesia Preprocedure Evaluation (Signed)
Anesthesia Evaluation  Patient identified by MRN, date of birth, ID band Patient awake    Reviewed: Allergy & Precautions, H&P , NPO status , Patient's Chart, lab work & pertinent test results  Airway Mallampati: II  TM Distance: >3 FB Neck ROM: full    Dental no notable dental hx.    Pulmonary    Pulmonary exam normal breath sounds clear to auscultation       Cardiovascular Normal cardiovascular exam Rhythm:regular Rate:Normal     Neuro/Psych  Headaches,    GI/Hepatic   Endo/Other    Renal/GU      Musculoskeletal   Abdominal   Peds  Hematology   Anesthesia Other Findings   Reproductive/Obstetrics                             Anesthesia Physical Anesthesia Plan  ASA: II  Anesthesia Plan: General   Post-op Pain Management:    Induction:   PONV Risk Score and Plan: 3 and Propofol  Airway Management Planned:   Additional Equipment:   Intra-op Plan:   Post-operative Plan:   Informed Consent: I have reviewed the patients History and Physical, chart, labs and discussed the procedure including the risks, benefits and alternatives for the proposed anesthesia with the patient or authorized representative who has indicated his/her understanding and acceptance.     Plan Discussed with: CRNA  Anesthesia Plan Comments:         Anesthesia Quick Evaluation

## 2016-11-27 NOTE — Anesthesia Postprocedure Evaluation (Signed)
Anesthesia Post Note  Patient: Sydney Horne  Procedure(s) Performed: Procedure(s) (LRB): COLONOSCOPY WITH PROPOFOL (N/A)  Patient location during evaluation: PACU Anesthesia Type: General Level of consciousness: awake and alert and oriented Pain management: satisfactory to patient Vital Signs Assessment: post-procedure vital signs reviewed and stable Respiratory status: spontaneous breathing, nonlabored ventilation and respiratory function stable Cardiovascular status: blood pressure returned to baseline and stable Postop Assessment: Adequate PO intake and No signs of nausea or vomiting Anesthetic complications: no    Raliegh Ip

## 2016-11-27 NOTE — Op Note (Signed)
[facility] Gastroenterology Patient Name: Sydney Horne Procedure Date: 11/27/2016 7:35 AM MRN: [id number] Account #: [id number] Date of Birth: Sep 22, 1965 Admit Type: Outpatient Age: 51 Room: [facility] OR ROOM 01 Gender: Female Note Status: Finalized Procedure:            Colonoscopy Indications:          Screening for colorectal malignant neoplasm Providers:            Lucilla Lame MD, MD Referring MD:         Kathrine Haddock (Referring MD) Medicines:            Propofol per Anesthesia Complications:        No immediate complications. Procedure:            Pre-Anesthesia Assessment:                       - Prior to the procedure, a History and Physical was                        performed, and patient medications and allergies were                        reviewed. The patient's tolerance of previous                        anesthesia was also reviewed. The risks and benefits of                        the procedure and the sedation options and risks were                        discussed with the patient. All questions were                        answered, and informed consent was obtained. Prior                        Anticoagulants: The patient has taken no previous                        anticoagulant or antiplatelet agents. ASA Grade                        Assessment: II - A patient with mild systemic disease.                        After reviewing the risks and benefits, the patient was                        deemed in satisfactory condition to undergo the                        procedure.                       After obtaining informed consent, the colonoscope was                        passed under direct vision. Throughout the procedure,  the patient's blood pressure, pulse, and oxygen                        saturations were monitored continuously. The Olympus                        CF-HQ190L Colonoscope (S#. [id number]) was introduced             through the anus and advanced to the the cecum,                        identified by appendiceal orifice and ileocecal valve.                        The colonoscopy was performed without difficulty. The                        patient tolerated the procedure well. The quality of                        the bowel preparation was excellent. Findings:      The perianal and digital rectal examinations were normal.      The colon (entire examined portion) appeared normal. Impression:           - The entire examined colon is normal.                       - No specimens collected. Recommendation:       - Discharge patient to home.                       - Resume previous diet.                       - Continue present medications.                       - Repeat colonoscopy in 10 years for screening unless                        any change in family history or lower GI problems. Procedure Code(s):    --- Professional ---                       [phone], Colonoscopy, flexible; diagnostic, including                        collection of specimen(s) by brushing or washing, when                        performed (separate procedure) Diagnosis Code(s):    --- Professional ---                       Z12.11, Encounter for screening for malignant neoplasm                        of colon CPT copyright 2016 [employer]. All rights reserved. The codes documented in this report are preliminary and upon coder review may  be revised to meet current compliance requirements. Lucilla Lame MD, MD 11/27/2016 8:09:50 AM This report has been signed electronically. Number  of Addenda: 0 Note Initiated On: 11/27/2016 7:35 AM Scope Withdrawal Time: 0 hours 8 minutes 3 seconds  Total Procedure Duration: 0 hours 12 minutes 31 seconds       [facility]

## 2016-11-28 ENCOUNTER — Encounter: Payer: Self-pay | Admitting: Gastroenterology

## 2016-12-18 LAB — LIPID PANEL
Cholesterol: 166 (ref 0–200)
HDL: 48 (ref 35–70)
LDL CALC: 93
Triglycerides: 124 (ref 40–160)

## 2016-12-18 LAB — BASIC METABOLIC PANEL: Glucose: 85

## 2016-12-18 LAB — HEMOGLOBIN A1C: HEMOGLOBIN A1C: 5.7

## 2017-01-09 ENCOUNTER — Other Ambulatory Visit: Payer: Self-pay

## 2017-01-09 MED ORDER — ESTRADIOL 0.1 MG/GM VA CREA
1.0000 | TOPICAL_CREAM | VAGINAL | 3 refills | Status: DC
Start: 2017-01-11 — End: 2017-03-19

## 2017-01-12 ENCOUNTER — Encounter: Payer: Self-pay | Admitting: Unknown Physician Specialty

## 2017-01-12 ENCOUNTER — Ambulatory Visit (INDEPENDENT_AMBULATORY_CARE_PROVIDER_SITE_OTHER): Payer: 59 | Admitting: Unknown Physician Specialty

## 2017-01-12 VITALS — BP 124/77 | HR 73 | Temp 98.9°F | Ht 66.5 in | Wt 178.6 lb

## 2017-01-12 DIAGNOSIS — S63641A Sprain of metacarpophalangeal joint of right thumb, initial encounter: Secondary | ICD-10-CM | POA: Insufficient documentation

## 2017-01-12 DIAGNOSIS — M25473 Effusion, unspecified ankle: Secondary | ICD-10-CM | POA: Diagnosis not present

## 2017-01-12 DIAGNOSIS — S5331XA Traumatic rupture of right ulnar collateral ligament, initial encounter: Secondary | ICD-10-CM

## 2017-01-12 DIAGNOSIS — E663 Overweight: Secondary | ICD-10-CM | POA: Diagnosis not present

## 2017-01-12 DIAGNOSIS — Z23 Encounter for immunization: Secondary | ICD-10-CM | POA: Diagnosis not present

## 2017-01-12 DIAGNOSIS — Z0000 Encounter for general adult medical examination without abnormal findings: Secondary | ICD-10-CM

## 2017-01-12 NOTE — Assessment & Plan Note (Signed)
Information given.  Consider referral

## 2017-01-12 NOTE — Progress Notes (Addendum)
BP 124/77   Pulse 73   Temp 98.9 F (37.2 C)   Ht 5' 6.5" (1.689 m)   Wt 178 lb 9.6 oz (81 kg)   LMP 12/13/2016 (Approximate)   SpO2 99%   BMI 28.39 kg/m    Subjective:    Patient ID: Sydney Horne, female    DOB: 01-17-1966, 51 y.o.   MRN: [id number]  HPI: Sydney Horne is a 51 y.o. female  Chief Complaint  Patient presents with  . Annual Exam   Swollen ankles.  This is new.  Noticed this in the last 3 months.  States it goes down at night.  Noticed at one time happened just during tax season while sitting a lot.  Not she notices it not in tax season and some days when she is more active.  Takes Ibuprofen 2-3 times/week when she has headaches.    Brought in biometric screening with normal lipid panel (LDL 94) GFR of 92.  Hgb A1C was 5.7  Exercise - trying to get to the gym 2 days and finding it difficult.  Doesn't have a good place to walk Diet - Focusing on getting weight under control.  Goal was to be down to 170 in August.  Working on decreasing fast food and cutting back on sugar.    Thumb laxity and decrease strength.    Social History   Social History  . Marital status: Married    Spouse name: N/A  . Number of children: N/A  . Years of education: N/A   Occupational History  . Not on file.   Social History Main Topics  . Smoking status: Never Smoker  . Smokeless tobacco: Never Used  . Alcohol use 0.0 oz/week     Comment: 1 drink/mo  . Drug use: No  . Sexual activity: Yes    Birth control/ protection: None   Other Topics Concern  . Not on file   Social History Narrative  . No narrative on file   Family History  Problem Relation Age of Onset  . Breast cancer Mother 22  . Cancer Mother        breast  . Cancer Father        prostate  . Breast cancer Sister 72  . Cancer Sister        breast  . Diabetes Sister   . Colon cancer Sister   . Breast cancer Maternal Aunt   . Breast cancer Paternal Aunt   . Breast cancer Paternal Grandmother   .  Cancer Paternal Grandmother        breast  . Diabetes Maternal Grandmother   . Heart attack Maternal Grandfather   . Diabetes Sister   . Ovarian cancer Neg Hx    Past Medical History:  Diagnosis Date  . Environmental allergies   . Headache    2x/mo  . Heart murmur    followed by PCP  . Kidney stones   . Neck pain    sees chiropractor for adjustment when having problems  . Vertigo 2017   2 episodes   Past Surgical History:  Procedure Laterality Date  . bladder control surgery    . COLONOSCOPY WITH PROPOFOL N/A 11/27/2016   Procedure: COLONOSCOPY WITH PROPOFOL;  Surgeon: Lucilla Lame, MD;  Location: [facility];  Service: Endoscopy;  Laterality: N/A;    Relevant past medical, surgical, family and social history reviewed and updated as indicated. Interim medical history since our last visit reviewed. Allergies and  medications reviewed and updated.  Review of Systems  Constitutional: Negative.   HENT: Negative.   Eyes: Negative.   Respiratory: Negative.   Cardiovascular: Negative.   Gastrointestinal: Negative.   Endocrine: Negative.   Genitourinary: Negative.   Skin: Negative.   Allergic/Immunologic: Negative.   Neurological: Negative.   Hematological: Negative.   Psychiatric/Behavioral: Negative.     Per HPI unless specifically indicated above     Objective:    BP 124/77   Pulse 73   Temp 98.9 F (37.2 C)   Ht 5' 6.5" (1.689 m)   Wt 178 lb 9.6 oz (81 kg)   LMP 12/13/2016 (Approximate)   SpO2 99%   BMI 28.39 kg/m   Wt Readings from Last 3 Encounters:  01/12/17 178 lb 9.6 oz (81 kg)  11/27/16 176 lb (79.8 kg)  09/12/16 177 lb 4.8 oz (80.4 kg)    Physical Exam  Constitutional: She is oriented to person, place, and time. She appears well-developed and well-nourished.  HENT:  Head: Normocephalic and atraumatic.  Eyes: Pupils are equal, round, and reactive to light. Right eye exhibits no discharge. Left eye exhibits no discharge. No scleral icterus.   Neck: Normal range of motion. Neck supple. Carotid bruit is not present. No thyromegaly present.  Cardiovascular: Normal rate, regular rhythm and normal heart sounds.  Exam reveals no gallop and no friction rub.   No murmur heard. Pulmonary/Chest: Effort normal and breath sounds normal. No respiratory distress. She has no wheezes. She has no rales.  Abdominal: Soft. Bowel sounds are normal. There is no tenderness. There is no rebound.  Genitourinary: No breast swelling, tenderness or discharge.  Musculoskeletal: Normal range of motion.  Thumb with laxity  Lymphadenopathy:    She has no cervical adenopathy.  Neurological: She is alert and oriented to person, place, and time.  Skin: Skin is warm, dry and intact. No rash noted.  Psychiatric: She has a normal mood and affect. Her speech is normal and behavior is normal. Judgment and thought content normal. Cognition and memory are normal.    Results for orders placed or performed in visit on 05/02/16  UA/M w/rflx Culture, Routine  Result Value Ref Range   Specific Gravity, UA 1.020 1.005 - 1.030   pH, UA 7.0 5.0 - 7.5   Color, UA Yellow Yellow   Appearance Ur Clear Clear   Leukocytes, UA Negative Negative   Protein, UA Negative Negative/Trace   Glucose, UA Negative Negative   Ketones, UA Negative Negative   RBC, UA Negative Negative   Bilirubin, UA Negative Negative   Urobilinogen, Ur 0.2 0.2 - 1.0 mg/dL   Nitrite, UA Negative Negative  -+++++++++++++++++++++++++++++++++++---------------------------------------------    Assessment & Plan:   Problem List Items Addressed This Visit      Unprioritized   Ankle swelling    Discussed diet and decreasing Ibuprofen      Relevant Orders   Comprehensive metabolic panel   CBC with Differential/Platelet   TSH   Gamekeeper's thumb of right hand    Information given.  Consider referral      Overweight    Hgb A1C is 5.7.  Discussed diet changes       Other Visit Diagnoses     Need for Tdap vaccination    -  Primary   Relevant Orders   Tdap vaccine greater than or equal to 7yo IM (Completed)   Routine general medical examination at a health care facility  Follow up plan: No Follow-up on file.

## 2017-01-12 NOTE — Assessment & Plan Note (Signed)
Hgb A1C is 5.7.  Discussed diet changes

## 2017-01-12 NOTE — Assessment & Plan Note (Signed)
Discussed diet and decreasing Ibuprofen

## 2017-01-12 NOTE — Addendum Note (Signed)
Addended by: Kathrine Haddock on: 01/12/2017 03:42 PM   Modules accepted: Level of Service

## 2017-01-12 NOTE — Patient Instructions (Addendum)
Tdap Vaccine (Tetanus, Diphtheria and Pertussis): What You Need to Know 1. Why get vaccinated? Tetanus, diphtheria and pertussis are very serious diseases. Tdap vaccine can protect us from these diseases. And, Tdap vaccine given to pregnant women can protect newborn babies against pertussis. TETANUS (Lockjaw) is rare in the [place] today. It causes painful muscle tightening and stiffness, usually all over the body.  It can lead to tightening of muscles in the head and neck so you can't open your mouth, swallow, or sometimes even breathe. Tetanus kills about 1 out of 10 people who are infected even after receiving the best medical care.  DIPHTHERIA is also rare in the [place] today. It can cause a thick coating to form in the back of the throat.  It can lead to breathing problems, heart failure, paralysis, and death.  PERTUSSIS (Whooping Cough) causes severe coughing spells, which can cause difficulty breathing, vomiting and disturbed sleep.  It can also lead to weight loss, incontinence, and rib fractures. Up to 2 in 100 adolescents and 5 in 100 adults with pertussis are hospitalized or have complications, which could include pneumonia or death.  These diseases are caused by bacteria. Diphtheria and pertussis are spread from person to person through secretions from coughing or sneezing. Tetanus enters the body through cuts, scratches, or wounds. Before vaccines, as many as 200,000 cases of diphtheria, 200,000 cases of pertussis, and hundreds of cases of tetanus, were reported in the [place] each year. Since vaccination began, reports of cases for tetanus and diphtheria have dropped by about 99% and for pertussis by about 80%. 2. Tdap vaccine Tdap vaccine can protect adolescents and adults from tetanus, diphtheria, and pertussis. One dose of Tdap is routinely given at age 11 or 12. People who did not get Tdap at that age should get it as soon as possible. Tdap is especially  important for healthcare professionals and anyone having close contact with a baby younger than 12 months. Pregnant women should get a dose of Tdap during every pregnancy, to protect the newborn from pertussis. Infants are most at risk for severe, life-threatening complications from pertussis. Another vaccine, called Td, protects against tetanus and diphtheria, but not pertussis. A Td booster should be given every 10 years. Tdap may be given as one of these boosters if you have never gotten Tdap before. Tdap may also be given after a severe cut or burn to prevent tetanus infection. Your doctor or the person giving you the vaccine can give you more information. Tdap may safely be given at the same time as other vaccines. 3. Some people should not get this vaccine  A person who has ever had a life-threatening allergic reaction after a previous dose of any diphtheria, tetanus or pertussis containing vaccine, OR has a severe allergy to any part of this vaccine, should not get Tdap vaccine. Tell the person giving the vaccine about any severe allergies.  Anyone who had coma or long repeated seizures within 7 days after a childhood dose of DTP or DTaP, or a previous dose of Tdap, should not get Tdap, unless a cause other than the vaccine was found. They can still get Td.  Talk to your doctor if you: ? have seizures or another nervous system problem, ? had severe pain or swelling after any vaccine containing diphtheria, tetanus or pertussis, ? ever had a condition called Guillain-Barr Syndrome (GBS), ? aren't feeling well on the day the shot is scheduled. 4. Risks With any medicine, including   vaccines, there is a chance of side effects. These are usually mild and go away on their own. Serious reactions are also possible but are rare. Most people who get Tdap vaccine do not have any problems with it. Mild problems following Tdap: (Did not interfere with activities)  Pain where the shot was given (about  3 in 4 adolescents or 2 in 3 adults)  Redness or swelling where the shot was given (about 1 person in 5)  Mild fever of at least 100.4F (up to about 1 in 25 adolescents or 1 in 100 adults)  Headache (about 3 or 4 people in 10)  Tiredness (about 1 person in 3 or 4)  Nausea, vomiting, diarrhea, stomach ache (up to 1 in 4 adolescents or 1 in 10 adults)  Chills, sore joints (about 1 person in 10)  Body aches (about 1 person in 3 or 4)  Rash, swollen glands (uncommon)  Moderate problems following Tdap: (Interfered with activities, but did not require medical attention)  Pain where the shot was given (up to 1 in 5 or 6)  Redness or swelling where the shot was given (up to about 1 in 16 adolescents or 1 in 12 adults)  Fever over 102F (about 1 in 100 adolescents or 1 in 250 adults)  Headache (about 1 in 7 adolescents or 1 in 10 adults)  Nausea, vomiting, diarrhea, stomach ache (up to 1 or 3 people in 100)  Swelling of the entire arm where the shot was given (up to about 1 in 500).  Severe problems following Tdap: (Unable to perform usual activities; required medical attention)  Swelling, severe pain, bleeding and redness in the arm where the shot was given (rare).  Problems that could happen after any vaccine:  People sometimes faint after a medical procedure, including vaccination. Sitting or lying down for about 15 minutes can help prevent fainting, and injuries caused by a fall. Tell your doctor if you feel dizzy, or have vision changes or ringing in the ears.  Some people get severe pain in the shoulder and have difficulty moving the arm where a shot was given. This happens very rarely.  Any medication can cause a severe allergic reaction. Such reactions from a vaccine are very rare, estimated at fewer than 1 in a million doses, and would happen within a few minutes to a few hours after the vaccination. As with any medicine, there is a very remote chance of a vaccine  causing a serious injury or death. The safety of vaccines is always being monitored. For more information, visit: www.cdc.gov/vaccinesafety/ 5. What if there is a serious problem? What should I look for? Look for anything that concerns you, such as signs of a severe allergic reaction, very high fever, or unusual behavior. Signs of a severe allergic reaction can include hives, swelling of the face and throat, difficulty breathing, a fast heartbeat, dizziness, and weakness. These would usually start a few minutes to a few hours after the vaccination. What should I do?  If you think it is a severe allergic reaction or other emergency that can't wait, call 9-1-1 or get the person to the nearest hospital. Otherwise, call your doctor.  Afterward, the reaction should be reported to the Vaccine Adverse Event Reporting System (VAERS). Your doctor might file this report, or you can do it yourself through the VAERS web site at www.vaers.hhs.gov, or by calling 1-800-822-7967. ? VAERS does not give medical advice. 6. The National Vaccine Injury Compensation Program The National   Vaccine Injury Compensation Program (VICP) is a federal program that was created to compensate people who may have been injured by certain vaccines. Persons who believe they may have been injured by a vaccine can learn about the program and about filing a claim by calling 937-245-9817 or visiting the [place] website at GoldCloset.com.ee. There is a time limit to file a claim for compensation. 7. How can I learn more?  Ask your doctor. He or she can give you the vaccine package insert or suggest other sources of information.  Call your local or state health department.  Contact the Centers for Disease Control and Prevention (CDC): ? Call 920 421 9820 (1-800-CDC-INFO) or ? Visit CDC's website at http://hunter.com/ CDC Tdap Vaccine VIS (07/08/13) ------------------------------------------------------------ Think  you're too busy to work out? We have the workout for you. In minutes, high-intensity interval training (H.I.I.T.) will have you sweating, breathing hard and maximizing the health benefits of exercise without the time commitment. Best of all, it's scientifically proven to work.  What Is H.I.I.T.? SHORT WORKOUTS 101 High-intensity interval training - referred to as H.I.I.T. - is based on the idea that short bursts of strenuous exercise can have a big impact on the body. If moderate exercise - like a 20-minute jog - is good for your heart, lungs and metabolism, H.I.I.T. packs the benefits of that workout and more into a few minutes. It may sound too good to be true, but learning this exercise technique and adapting it to your life can mean saving hours at the gym. If you think you don't have time to exercise, H.I.I.T. may be the workout for you.  You can try it with any aerobic activity you like. The principles of H.I.I.T. can be applied to running, biking, stair climbing, swimming, jumping rope, rowing, even hopping or skipping. (Yes, skipping!)  The downside? Even though H.I.I.T. lasts only minutes, the workouts are tough, requiring you to push your body near its limit.  HOW INTENSE IS HIGH INTENSITY? High-intensity exercise is obviously not a casual stroll down the street, but it's not a run-till-your-lungs-pop explosion, either. Think breathless, not winded. Heart-pounding, not exploding. Legs pumping, but not uncontrolled.  You don't need any fancy heart rate monitors to do these workouts. Use cues from your body as a guide. In the middle of a high-intensity workout you should be able to say single words, but not complete whole sentences. So, if you can keep chatting to your workout partner during this workout, pump it up a few notches.  03-03-29 Training This simple program will help you make the most of a short workout by improving heart health and endurance. Try it with your favorite  cardiovascular activity. The essentials of 03-03-29 training are simple. Run, ride or perhaps row on a rowing machine gently for 30 seconds, accelerate to a moderate pace for 20 seconds, then sprint as hard as you can for 10 seconds. (It should be called 30-20-10 training, obviously, but that is not as catchy.) Repeat.  You don't even need a stopwatch to monitor the 30-, 20-, and 10-second time changes. You can just count to yourself, which seems to make the intervals pass more quickly.  Best of all? The grueling, all-out portion of the workout lasts for only 10 seconds. C'mon, you can do anything for 10 seconds, right?  Got 10 Minutes? A solitary minute of hard work buried in 10 minutes of activity can make a big difference.  The 10-Minute Workout If you like to run, bike, row or  swim - just a little bit - this workout is a great option for you. Step 1 Warm up for 2 minutes Step 2 Pedal, run or swim all-out for 20 seconds. Repeat 2 more times Warm down for 3 minutes    GET STARTED To benefit the most from really, really short workouts, you need to build the habit of doing them into your hectic life. Ideally, you'll complete the workout three times a week. The best way to build that habit is to start small and be willing to tweak your schedule where you can to accommodate your new workout.  First set up a spot in your house for your workout, equipped with whatever you need to get the job done: sneakers, a chair, a towel, etc. Then slot your workout in before you would normally shower. (You can even do it in the bathroom.) Or wake up five minutes earlier and do it first thing in the morning, so you can head off to work feeling accomplished. Or do it during your lunch hour. Run up your office's stairs or grab a private conference room for just a few minutes. Or work it into your commute. If you walk or bike to work, add some heavy intervals on the way home.  GET A BOOST FROM MUSIC Creating a  workout playlist of high-energy tunes you love will not make your workout feel easier, but it may cause you to exercise harder without even realizing it. Best of all, if you are doing a really short workout, you need only one or two great tunes to get you through. If you are willing to try something a bit different, make your own music as you exercise. Sing, hum, clap your hands, whatever you can do to jam along to your playlist. It may give you an extra boost to finish strong.  Find a song or podcast that's the length of your really, really short workout. By the time the song is over, you're done.  Excerpted from the [place] Well column http://www.nytimes.com/well/guides/really-really-short-workouts?smid=fb-nytwell&smtyp=pay  Ulnar Collateral Ligament Injury of the Thumb A ligament is a strong band of tissue that connects and supports bones. Ulnar collateral ligament (UCL) injury happens when the UCL at the base of the thumb is stretched or torn. A tear can be either partial or complete. The severity of the injury depends on how much of the ligament was damaged or torn. The UCL ligament is important for normal use of the thumb. This ligament helps you to use and move your thumb. UCL injury can happen suddenly (acuteinjury) or gradually (chronic injury) with repeated overstretching of the ligament. If it is not treated properly, UCL injury can lead to arthritis. What are the causes? This injury is caused by forcefully moving the thumb past its normal range of motion toward the wrist. If you extend your hands to catch an object or to protect yourself while falling, the force of the impact can cause your ligament to stretch too much. This excess tension can also cause your ligament to tear. What increases the risk? This injury is more likely to occur in:  People who have had a previous thumb injury or sprain.  People who play contact sports or sports that involve catching balls, such as baseball,  basketball, or football.  People who do activities that increase the chance that the thumb will be pulled away from the rest of the hand.  People who have poor hand strength and flexibility.  People who do not  warm up properly before activities.  What are the signs or symptoms? Symptoms of this injury include:  Pain or tenderness over the injured area with movement of the thumb.  Pain when the injured area is pressed.  Bruising or redness at the base of the thumb. This can spread to the whole thumb and part of the hand.  Swelling over the injured area.  Difficulty grasping or pinching with the injured thumb due to weakness or pain.  If the injury is severe, a lump (mass) may be felt under the skin in the injured area. How is this diagnosed? This injury is diagnosed with a medical history and physical exam. You may also have imaging studies, including:  X-ray.  Ultrasound.  MRI.  How is this treated? Treatment varies depending on the severity of your injury. If the UCL is overstretched or partially torn, treatment usually involves keeping your thumb in a fixed position (immobilization) for a period of time. To help you do this, your health care provider will apply a brace, cast, or splint to keep your thumb from moving until it heals. If the UCL is fully torn, you may need surgery to reconnect the ligament to the bone. After surgery, a cast or splint will be applied and it will need to stay on your thumb while it heals. Your health care provider may also suggest exercises or physical therapy to strengthen your thumb. Follow these instructions at home: If you have a cast:  Do not stick anything inside the cast to scratch your skin. Doing that increases your risk of infection.  Check the skin around the cast every day. Report any concerns to your health care provider. You may put lotion on dry skin around the edges of the cast. Do not apply lotion to the skin underneath the  cast.  Keep the cast clean and dry. If you have a splint or brace:  Wear it as told by your health care provider. Remove it only as told by your health care provider.  Loosen it if your fingers become numb and tingle, or if they turn cold and blue.  Keep the brace or splint clean and dry. Bathing  Cover the cast or splint and bandage (dressing) with a watertight plastic bag to protect it from water while you take a bath or shower. Do not let the cast or splint and dressing get wet. Managing pain, stiffness, and swelling  If directed, apply ice to the injured area: ? Put ice in a plastic bag. ? Place a towel between your skin and the bag. ? Leave the ice on for 20 minutes, 2-3 times per day.  Move your fingers often to avoid stiffness and to lessen swelling.  Raise (elevate) the injured area above the level of your heart while you are sitting or lying down. Driving  Do not drive or operate heavy machinery while taking prescription pain medicine.  Ask your health care provider when it is safe to drive if you have a cast, splint, or brace on your hand. General instructions  Do not put pressure on any part of your cast or splint until it is fully hardened. This may take several hours.  Take over-the-counter and prescription medicines only as told by your health care provider.  Keep all follow-up visits as told by your health care provider. This is important.  Do not wear rings on your injured thumb.  Do any exercise or physical therapy as told by your health care provider. Contact  a health care provider if:  Your pain is not controlled with medicine.  Your bruising or swelling gets worse.  Your cast or splint is damaged.  Your thumb is numb or blue.  Your thumb feels colder than normal. This information is not intended to replace advice given to you by your health care provider. Make sure you discuss any questions you have with your health care provider. Document  Released: 05/01/2005 Document Revised: 01/02/2016 Document Reviewed: 07/08/2014 Elsevier Interactive Patient Education  2018 [place] thumb

## 2017-01-13 LAB — COMPREHENSIVE METABOLIC PANEL
ALT: 10 IU/L (ref 0–32)
AST: 13 IU/L (ref 0–40)
Albumin/Globulin Ratio: 2.4 — ABNORMAL HIGH (ref 1.2–2.2)
Albumin: 4.5 g/dL (ref 3.5–5.5)
Alkaline Phosphatase: 69 IU/L (ref 39–117)
BILIRUBIN TOTAL: 0.3 mg/dL (ref 0.0–1.2)
BUN / CREAT RATIO: 14 (ref 9–23)
BUN: 12 mg/dL (ref 6–24)
CHLORIDE: 104 mmol/L (ref 96–106)
CO2: 24 mmol/L (ref 20–29)
Calcium: 9.6 mg/dL (ref 8.7–10.2)
Creatinine, Ser: 0.84 mg/dL (ref 0.57–1.00)
GFR calc non Af Amer: 80 mL/min/{1.73_m2} (ref >59)
GFR, EST AFRICAN AMERICAN: 92 mL/min/{1.73_m2} (ref >59)
Globulin, Total: 1.9 g/dL (ref 1.5–4.5)
Glucose: 120 mg/dL — ABNORMAL HIGH (ref 65–99)
Potassium: 4.1 mmol/L (ref 3.5–5.2)
Sodium: 142 mmol/L (ref 134–144)
TOTAL PROTEIN: 6.4 g/dL (ref 6.0–8.5)

## 2017-01-13 LAB — CBC WITH DIFFERENTIAL/PLATELET
BASOS ABS: 0 10*3/uL (ref 0.0–0.2)
Basos: 0 %
EOS (ABSOLUTE): 0 10*3/uL (ref 0.0–0.4)
EOS: 0 %
HEMATOCRIT: 40.4 % (ref 34.0–46.6)
Hemoglobin: 13.1 g/dL (ref 11.1–15.9)
IMMATURE GRANULOCYTES: 0 %
Immature Grans (Abs): 0 10*3/uL (ref 0.0–0.1)
Lymphocytes Absolute: 1.7 10*3/uL (ref 0.7–3.1)
Lymphs: 20 %
MCH: 30.1 pg (ref 26.6–33.0)
MCHC: 32.4 g/dL (ref 31.5–35.7)
MCV: 93 fL (ref 79–97)
MONOS ABS: 0.9 10*3/uL (ref 0.1–0.9)
Monocytes: 10 %
Neutrophils Absolute: 5.8 10*3/uL (ref 1.4–7.0)
Neutrophils: 70 %
PLATELETS: 211 10*3/uL (ref 150–379)
RBC: 4.35 x10E6/uL (ref 3.77–5.28)
RDW: 13.9 % (ref 12.3–15.4)
WBC: 8.4 10*3/uL (ref 3.4–10.8)

## 2017-01-13 LAB — TSH: TSH: 1.74 u[IU]/mL (ref 0.450–4.500)

## 2017-01-16 ENCOUNTER — Encounter: Payer: Self-pay | Admitting: Unknown Physician Specialty

## 2017-03-01 ENCOUNTER — Telehealth: Payer: Self-pay | Admitting: Unknown Physician Specialty

## 2017-03-01 DIAGNOSIS — M79641 Pain in right hand: Secondary | ICD-10-CM

## 2017-03-01 NOTE — Telephone Encounter (Signed)
Referral generated

## 2017-03-01 NOTE — Telephone Encounter (Signed)
Routing to provider  

## 2017-03-01 NOTE — Telephone Encounter (Signed)
Patient states she and Malachy Mood discussed patient being referred to ortho for her hand issue and at the time she wanted to wait but now she is ready for the referral to be placed.  She said to try to refer her to someone in the [place] area if they accept her insurance.  Thanks

## 2017-03-05 DIAGNOSIS — M189 Osteoarthritis of first carpometacarpal joint, unspecified: Secondary | ICD-10-CM | POA: Diagnosis not present

## 2017-03-05 DIAGNOSIS — S66019A Strain of long flexor muscle, fascia and tendon of unspecified thumb at wrist and hand level, initial encounter: Secondary | ICD-10-CM | POA: Diagnosis not present

## 2017-03-19 ENCOUNTER — Other Ambulatory Visit: Payer: Self-pay | Admitting: Obstetrics and Gynecology

## 2017-04-03 DIAGNOSIS — S66019A Strain of long flexor muscle, fascia and tendon of unspecified thumb at wrist and hand level, initial encounter: Secondary | ICD-10-CM | POA: Diagnosis not present

## 2017-04-03 DIAGNOSIS — M1811 Unilateral primary osteoarthritis of first carpometacarpal joint, right hand: Secondary | ICD-10-CM | POA: Diagnosis not present

## 2017-04-17 DIAGNOSIS — M1811 Unilateral primary osteoarthritis of first carpometacarpal joint, right hand: Secondary | ICD-10-CM | POA: Diagnosis not present

## 2017-04-17 DIAGNOSIS — S66019A Strain of long flexor muscle, fascia and tendon of unspecified thumb at wrist and hand level, initial encounter: Secondary | ICD-10-CM | POA: Diagnosis not present

## 2017-05-01 DIAGNOSIS — S66019A Strain of long flexor muscle, fascia and tendon of unspecified thumb at wrist and hand level, initial encounter: Secondary | ICD-10-CM | POA: Diagnosis not present

## 2017-05-01 DIAGNOSIS — M1811 Unilateral primary osteoarthritis of first carpometacarpal joint, right hand: Secondary | ICD-10-CM | POA: Diagnosis not present

## 2017-05-24 DIAGNOSIS — S66019A Strain of long flexor muscle, fascia and tendon of unspecified thumb at wrist and hand level, initial encounter: Secondary | ICD-10-CM | POA: Diagnosis not present

## 2017-05-24 DIAGNOSIS — M1811 Unilateral primary osteoarthritis of first carpometacarpal joint, right hand: Secondary | ICD-10-CM | POA: Diagnosis not present

## 2017-09-10 NOTE — Progress Notes (Signed)
GYN ANNUAL PREVENTATIVE CARE ENCOUNTER NOTE  Subjective:       Sydney Horne is a 52 y.o. para 72 female here for a routine annual gynecologic exam.  1. Painful cycle and irregular cycles; patient reports significant increased central cramps and low back pain with radiation into the buttocks she has been taking 600 mg of Advil several times a day. She also notes painful bowel movements the first day of her cycle.  She does not report any UTI type or irritative bladder symptoms.  Last cycle was 6 weeks ago and she is now spotting today.  For the last year she has been having monthly menses less than 7 days.  No intermenstrual bleeding.  Colonoscopy this past year was normal.  No major interval health condoms have been identified.  Gynecologic History LMP:3 /21/2019 Contraception: none Last Pap: 09/03/2014 negative/negative Last mammogram:10/24/2016  birad 2 Family history of breast cancer: mom and sister (sister-negative BRCA 1/BRCA2) History of TVT 2005 Dr. Amalia Hailey  Obstetric History OB History  Gravida Para Term Preterm AB Living  '2 2 2     2  ' SAB TAB Ectopic Multiple Live Births          2    # Outcome Date GA Lbr Len/2nd Weight Sex Delivery Anes PTL Lv  2 Term 1999   8 lb 12.8 oz (3.992 kg) M Vag-Spont   LIV  1 Term 1997   8 lb 1.6 oz (3.674 kg) F Vag-Spont   LIV    Past Medical History:  Diagnosis Date  . Environmental allergies   . Headache    2x/mo  . Heart murmur    followed by PCP  . Kidney stones   . Neck pain    sees chiropractor for adjustment when having problems  . Vertigo 2017   2 episodes    Past Surgical History:  Procedure Laterality Date  . bladder control surgery    . COLONOSCOPY WITH PROPOFOL N/A 11/27/2016   Procedure: COLONOSCOPY WITH PROPOFOL;  Surgeon: Lucilla Lame, MD;  Location: [facility];  Service: Endoscopy;  Laterality: N/A;    Current Outpatient Medications on File Prior to Visit  Medication Sig Dispense Refill  . calcium  citrate-vitamin D (CITRACAL+D) 315-200 MG-UNIT tablet Take 1 tablet by mouth daily.    . Cranberry (THERACRAN ONE PO) Take 1 tablet by mouth daily.    Marland Kitchen estradiol (ESTRACE) 0.1 MG/GM vaginal cream INSERT 1 APPLICATORFUL  VAGINALLY TWICE WEEKLY 127.5 g 3  . fluticasone (FLONASE) 50 MCG/ACT nasal spray Place 2 sprays into both nostrils as needed.     Marland Kitchen ibuprofen (ADVIL,MOTRIN) 200 MG tablet Take 200 mg by mouth every 6 (six) hours as needed for headache.    . Multiple Vitamin (MULTIVITAMIN) tablet Take 1 tablet by mouth daily.     No current facility-administered medications on file prior to visit.     Allergies  Allergen Reactions  . Other Nausea And Vomiting    sauerkraut  . Rye Grass Flower Pollen Extract [Gramineae Pollens] Nausea And Vomiting    Rye - food product  . Zithromax [Azithromycin] Diarrhea       . Adhesive [Tape] Rash    Band-aids    Social History   Socioeconomic History  . Marital status: Married    Spouse name: Not on file  . Number of children: Not on file  . Years of education: Not on file  . Highest education level: Not on file  Occupational History  .  Not on file  Social Needs  . Financial resource strain: Not on file  . Food insecurity:    Worry: Not on file    Inability: Not on file  . Transportation needs:    Medical: Not on file    Non-medical: Not on file  Tobacco Use  . Smoking status: Never Smoker  . Smokeless tobacco: Never Used  Substance and Sexual Activity  . Alcohol use: Yes    Alcohol/week: 0.0 oz    Comment: 1 drink/mo  . Drug use: No  . Sexual activity: Yes    Birth control/protection: None  Lifestyle  . Physical activity:    Days per week: Not on file    Minutes per session: Not on file  . Stress: Not on file  Relationships  . Social connections:    Talks on phone: Not on file    Gets together: Not on file    Attends religious service: Not on file    Active member of club or organization: Not on file    Attends meetings  of clubs or organizations: Not on file    Relationship status: Not on file  . Intimate partner violence:    Fear of current or ex partner: Not on file    Emotionally abused: Not on file    Physically abused: Not on file    Forced sexual activity: Not on file  Other Topics Concern  . Not on file  Social History Narrative  . Not on file    Family History  Problem Relation Age of Onset  . Breast cancer Mother 27  . Cancer Mother        breast  . Cancer Father        prostate  . Breast cancer Sister 41  . Cancer Sister        breast  . Diabetes Sister   . Colon cancer Sister   . Breast cancer Maternal Aunt   . Breast cancer Paternal Aunt   . Breast cancer Paternal Grandmother   . Cancer Paternal Grandmother        breast  . Diabetes Maternal Grandmother   . Heart attack Maternal Grandfather   . Diabetes Sister   . Ovarian cancer Neg Hx     The following portions of the patient's history were reviewed and updated as appropriate: allergies, current medications, past family history, past medical history, past social history, past surgical history and problem list.  Review of Systems  Review of Systems  Constitutional: Negative.        No vasomotor symptoms  HENT: Negative.   Eyes: Negative.   Respiratory: Negative.   Cardiovascular: Negative.   Gastrointestinal:       Painful bowel movements first day of cycle  Genitourinary:       Severe dysmenorrhea and brown pasty discharge perimenstrually is noted  Musculoskeletal: Negative.   Skin: Negative.   Neurological: Negative.   Endo/Heme/Allergies: Negative.   Psychiatric/Behavioral: Negative.     Objective:  BP 123/65   Pulse 81   Ht 5' 6.5" (1.689 m)   Wt 183 lb 8 oz (83.2 kg)   LMP 08/02/2017 (Approximate)   BMI 29.17 kg/m  Physical Exam  Constitutional: She is oriented to person, place, and time. She appears well-developed and well-nourished.  HENT:  Head: Normocephalic and atraumatic.  Eyes: Conjunctivae  and EOM are normal.  Neck: Normal range of motion. Neck supple. No thyromegaly present.  Cardiovascular: Normal rate and regular rhythm.  No  murmur heard. Pulmonary/Chest: Effort normal and breath sounds normal.  Abdominal: Soft. She exhibits no distension and no mass. There is no tenderness. There is no guarding.  Genitourinary:  Genitourinary Comments: External genitalia-normal BUS-normal Vagina-normal; estrogen cream  in the vault along with minimal menstrual blood Cervix-normal; no lesions; no cervical motion tenderness Uterus-retroverted, mobile, 1/4 tender Adnexa-nonpalpable and nontender Rectovaginal-normal sphincter tone; no rectal masses; posterior uterus is tender on rectovaginal exam without obvious nodularity or thickening  Musculoskeletal: Normal range of motion.  Lymphadenopathy:    She has no cervical adenopathy.  Neurological: She is alert and oriented to person, place, and time.  Skin: Skin is warm and dry.  Psychiatric: She has a normal mood and affect.  Breast: Bilateral fibrocystic changes, no masses not nodules, no axillary adenopathy  Assessment:   Annual gynecologic examination 52 y.o. Contraception: none  Climacteric Normal BMI-29 Family history breast cancer History of recurrent UTI Dysmenorrhea Possible endometriosis based on clinical exam and history  Plan:  Pap: pap/hpv Mammogram: Ordered  Colonoscopy -epipro colon- 2017 neg -colonoscopy 11/2016- wnl Labs: Primary care to perform Routine preventative health maintenance measures emphasized: Diet/Weight control, Tobacco Cessation and Alcohol/Drug use Calcium with vitamin D supplementation 1200 mg daily Estrace cream is refilled Maintain menstrual calendar monitoring for abnormal uterine bleeding (intermenstrual spotting or prolonged bleeding) Return to [place], CMA  Brayton Mars, MD  Note: This dictation was prepared with Dragon dictation along with smaller phrase  technology. Any transcriptional errors that result from this process are unintentional.

## 2017-09-13 ENCOUNTER — Encounter: Payer: Self-pay | Admitting: Obstetrics and Gynecology

## 2017-09-13 ENCOUNTER — Ambulatory Visit (INDEPENDENT_AMBULATORY_CARE_PROVIDER_SITE_OTHER): Payer: 59 | Admitting: Obstetrics and Gynecology

## 2017-09-13 VITALS — BP 123/65 | HR 81 | Ht 66.5 in | Wt 183.5 lb

## 2017-09-13 DIAGNOSIS — Z1211 Encounter for screening for malignant neoplasm of colon: Secondary | ICD-10-CM | POA: Diagnosis not present

## 2017-09-13 DIAGNOSIS — Z01419 Encounter for gynecological examination (general) (routine) without abnormal findings: Secondary | ICD-10-CM

## 2017-09-13 DIAGNOSIS — Z803 Family history of malignant neoplasm of breast: Secondary | ICD-10-CM

## 2017-09-13 DIAGNOSIS — Z800 Family history of malignant neoplasm of digestive organs: Secondary | ICD-10-CM | POA: Diagnosis not present

## 2017-09-13 NOTE — Patient Instructions (Addendum)
1.  Pap smear is done. 2.  Mammogram is ordered 3.  Stool guaiac cards are not given due to recent colonoscopy in 2018 4.  Screening labs are to be obtained through primary care 5.  Continue with healthy eating and exercise 6.  Continue with calcium and vitamin D supplementation daily 7.  Estrace cream intravaginal twice weekly is refilled 8.  Return in 1 year for annual exam  Health maintenance Health Maintenance for Postmenopausal Women Menopause is a normal process in which your reproductive ability comes to an end. This process happens gradually over a span of months to years, usually between the ages of 49 and 55. Menopause is complete when you have missed 12 consecutive menstrual periods. It is important to talk with your health care provider about some of the most common conditions that affect postmenopausal women, such as heart disease, cancer, and bone loss (osteoporosis). Adopting a healthy lifestyle and getting preventive care can help to promote your health and wellness. Those actions can also lower your chances of developing some of these common conditions. What should I know about menopause? During menopause, you may experience a number of symptoms, such as:  Moderate-to-severe hot flashes.  Night sweats.  Decrease in sex drive.  Mood swings.  Headaches.  Tiredness.  Irritability.  Memory problems.  Insomnia.  Choosing to treat or not to treat menopausal changes is an individual decision that you make with your health care provider. What should I know about hormone replacement therapy and supplements? Hormone therapy products are effective for treating symptoms that are associated with menopause, such as hot flashes and night sweats. Hormone replacement carries certain risks, especially as you become older. If you are thinking about using estrogen or estrogen with progestin treatments, discuss the benefits and risks with your health care provider. What should I know  about heart disease and stroke? Heart disease, heart attack, and stroke become more likely as you age. This may be due, in part, to the hormonal changes that your body experiences during menopause. These can affect how your body processes dietary fats, triglycerides, and cholesterol. Heart attack and stroke are both medical emergencies. There are many things that you can do to help prevent heart disease and stroke:  Have your blood pressure checked at least every 1-2 years. High blood pressure causes heart disease and increases the risk of stroke.  If you are 11-52 years old, ask your health care provider if you should take aspirin to prevent a heart attack or a stroke.  Do not use any tobacco products, including cigarettes, chewing tobacco, or electronic cigarettes. If you need help quitting, ask your health care provider.  It is important to eat a healthy diet and maintain a healthy weight. ? Be sure to include plenty of vegetables, fruits, low-fat dairy products, and lean protein. ? Avoid eating foods that are high in solid fats, added sugars, or salt (sodium).  Get regular exercise. This is one of the most important things that you can do for your health. ? Try to exercise for at least 150 minutes each week. The type of exercise that you do should increase your heart rate and make you sweat. This is known as moderate-intensity exercise. ? Try to do strengthening exercises at least twice each week. Do these in addition to the moderate-intensity exercise.  Know your numbers.Ask your health care provider to check your cholesterol and your blood glucose. Continue to have your blood tested as directed by your health care provider.  What should I know about cancer screening? There are several types of cancer. Take the following steps to reduce your risk and to catch any cancer development as early as possible. Breast Cancer  Practice breast self-awareness. ? This means understanding how your  breasts normally appear and feel. ? It also means doing regular breast self-exams. Let your health care provider know about any changes, no matter how small.  If you are 98 or older, have a clinician do a breast exam (clinical breast exam or CBE) every year. Depending on your age, family history, and medical history, it may be recommended that you also have a yearly breast X-ray (mammogram).  If you have a family history of breast cancer, talk with your health care provider about genetic screening.  If you are at high risk for breast cancer, talk with your health care provider about having an MRI and a mammogram every year.  Breast cancer (BRCA) gene test is recommended for women who have family members with BRCA-related cancers. Results of the assessment will determine the need for genetic counseling and BRCA1 and for BRCA2 testing. BRCA-related cancers include these types: ? Breast. This occurs in males or females. ? Ovarian. ? Tubal. This may also be called fallopian tube cancer. ? Cancer of the abdominal or pelvic lining (peritoneal cancer). ? Prostate. ? Pancreatic.  Cervical, Uterine, and Ovarian Cancer Your health care provider may recommend that you be screened regularly for cancer of the pelvic organs. These include your ovaries, uterus, and vagina. This screening involves a pelvic exam, which includes checking for microscopic changes to the surface of your cervix (Pap test).  For women ages 21-65, health care providers may recommend a pelvic exam and a Pap test every three years. For women ages 52-65, they may recommend the Pap test and pelvic exam, combined with testing for human papilloma virus (HPV), every five years. Some types of HPV increase your risk of cervical cancer. Testing for HPV may also be done on women of any age who have unclear Pap test results.  Other health care providers may not recommend any screening for nonpregnant women who are considered low risk for pelvic  cancer and have no symptoms. Ask your health care provider if a screening pelvic exam is right for you.  If you have had past treatment for cervical cancer or a condition that could lead to cancer, you need Pap tests and screening for cancer for at least 20 years after your treatment. If Pap tests have been discontinued for you, your risk factors (such as having a new sexual partner) need to be reassessed to determine if you should start having screenings again. Some women have medical problems that increase the chance of getting cervical cancer. In these cases, your health care provider may recommend that you have screening and Pap tests more often.  If you have a family history of uterine cancer or ovarian cancer, talk with your health care provider about genetic screening.  If you have vaginal bleeding after reaching menopause, tell your health care provider.  There are currently no reliable tests available to screen for ovarian cancer.  Lung Cancer Lung cancer screening is recommended for adults 32-56 years old who are at high risk for lung cancer because of a history of smoking. A yearly low-dose CT scan of the lungs is recommended if you:  Currently smoke.  Have a history of at least 30 pack-years of smoking and you currently smoke or have quit within the past  15 years. A pack-year is smoking an average of one pack of cigarettes per day for one year.  Yearly screening should:  Continue until it has been 15 years since you quit.  Stop if you develop a health problem that would prevent you from having lung cancer treatment.  Colorectal Cancer  This type of cancer can be detected and can often be prevented.  Routine colorectal cancer screening usually begins at age 9 and continues through age 91.  If you have risk factors for colon cancer, your health care provider may recommend that you be screened at an earlier age.  If you have a family history of colorectal cancer, talk with  your health care provider about genetic screening.  Your health care provider may also recommend using home test kits to check for hidden blood in your stool.  A small camera at the end of a tube can be used to examine your colon directly (sigmoidoscopy or colonoscopy). This is done to check for the earliest forms of colorectal cancer.  Direct examination of the colon should be repeated every 5-10 years until age 19. However, if early forms of precancerous polyps or small growths are found or if you have a family history or genetic risk for colorectal cancer, you may need to be screened more often.  Skin Cancer  Check your skin from head to toe regularly.  Monitor any moles. Be sure to tell your health care provider: ? About any new moles or changes in moles, especially if there is a change in a mole's shape or color. ? If you have a mole that is larger than the size of a pencil eraser.  If any of your family members has a history of skin cancer, especially at a young age, talk with your health care provider about genetic screening.  Always use sunscreen. Apply sunscreen liberally and repeatedly throughout the day.  Whenever you are outside, protect yourself by wearing long sleeves, pants, a wide-brimmed hat, and sunglasses.  What should I know about osteoporosis? Osteoporosis is a condition in which bone destruction happens more quickly than new bone creation. After menopause, you may be at an increased risk for osteoporosis. To help prevent osteoporosis or the bone fractures that can happen because of osteoporosis, the following is recommended:  If you are 66-54 years old, get at least 1,000 mg of calcium and at least 600 mg of vitamin D per day.  If you are older than age 59 but younger than age 81, get at least 1,200 mg of calcium and at least 600 mg of vitamin D per day.  If you are older than age 89, get at least 1,200 mg of calcium and at least 800 mg of vitamin D per  day.  Smoking and excessive alcohol intake increase the risk of osteoporosis. Eat foods that are rich in calcium and vitamin D, and do weight-bearing exercises several times each week as directed by your health care provider. What should I know about how menopause affects my mental health? Depression may occur at any age, but it is more common as you become older. Common symptoms of depression include:  Low or sad mood.  Changes in sleep patterns.  Changes in appetite or eating patterns.  Feeling an overall lack of motivation or enjoyment of activities that you previously enjoyed.  Frequent crying spells.  Talk with your health care provider if you think that you are experiencing depression. What should I know about immunizations? It is important  that you get and maintain your immunizations. These include:  Tetanus, diphtheria, and pertussis (Tdap) booster vaccine.  Influenza every year before the flu season begins.  Pneumonia vaccine.  Shingles vaccine.  Your health care provider may also recommend other immunizations. This information is not intended to replace advice given to you by your health care provider. Make sure you discuss any questions you have with your health care provider. Document Released: 06/23/2005 Document Revised: 11/19/2015 Document Reviewed: 02/02/2015 Elsevier Interactive Patient Education  2018 [employer]  

## 2017-09-18 DIAGNOSIS — M1811 Unilateral primary osteoarthritis of first carpometacarpal joint, right hand: Secondary | ICD-10-CM | POA: Diagnosis not present

## 2017-09-18 DIAGNOSIS — S66019A Strain of long flexor muscle, fascia and tendon of unspecified thumb at wrist and hand level, initial encounter: Secondary | ICD-10-CM | POA: Diagnosis not present

## 2017-09-19 NOTE — Progress Notes (Signed)
Patient ID: Sydney Horne, female   DOB: May 06, 1966, 52 y.o.   MRN: [id number]   09/19/2017 3:39- Pap not done at [facility] on 09/13/2017. Pt was starting her cycle. Will do pap in 2020. CM

## 2017-10-16 ENCOUNTER — Ambulatory Visit
Admission: RE | Admit: 2017-10-16 | Discharge: 2017-10-16 | Disposition: A | Discharge status: Home / Self Care | Payer: 59 | Source: Ambulatory Visit | Attending: Obstetrics and Gynecology | Admitting: Obstetrics and Gynecology

## 2017-10-16 DIAGNOSIS — Z803 Family history of malignant neoplasm of breast: Secondary | ICD-10-CM | POA: Diagnosis not present

## 2017-10-16 DIAGNOSIS — Z1231 Encounter for screening mammogram for malignant neoplasm of breast: Secondary | ICD-10-CM | POA: Diagnosis not present

## 2017-11-28 DIAGNOSIS — D2261 Melanocytic nevi of right upper limb, including shoulder: Secondary | ICD-10-CM | POA: Diagnosis not present

## 2017-11-28 DIAGNOSIS — D2262 Melanocytic nevi of left upper limb, including shoulder: Secondary | ICD-10-CM | POA: Diagnosis not present

## 2017-11-28 DIAGNOSIS — D225 Melanocytic nevi of trunk: Secondary | ICD-10-CM | POA: Diagnosis not present

## 2018-01-17 ENCOUNTER — Encounter: Payer: 59 | Admitting: Unknown Physician Specialty

## 2018-01-18 ENCOUNTER — Ambulatory Visit: Payer: 59 | Admitting: Unknown Physician Specialty

## 2018-01-18 ENCOUNTER — Other Ambulatory Visit: Payer: 59

## 2018-01-18 ENCOUNTER — Encounter: Payer: Self-pay | Admitting: Unknown Physician Specialty

## 2018-01-18 ENCOUNTER — Telehealth: Payer: Self-pay

## 2018-01-18 ENCOUNTER — Encounter: Payer: 59 | Admitting: Unknown Physician Specialty

## 2018-01-18 VITALS — BP 113/74 | HR 65 | Temp 98.9°F | Ht 65.5 in | Wt 176.6 lb

## 2018-01-18 DIAGNOSIS — Z1329 Encounter for screening for other suspected endocrine disorder: Secondary | ICD-10-CM

## 2018-01-18 DIAGNOSIS — B353 Tinea pedis: Secondary | ICD-10-CM | POA: Diagnosis not present

## 2018-01-18 DIAGNOSIS — E663 Overweight: Secondary | ICD-10-CM

## 2018-01-18 DIAGNOSIS — Z131 Encounter for screening for diabetes mellitus: Secondary | ICD-10-CM

## 2018-01-18 DIAGNOSIS — Z0000 Encounter for general adult medical examination without abnormal findings: Secondary | ICD-10-CM

## 2018-01-18 DIAGNOSIS — Z13220 Encounter for screening for lipoid disorders: Secondary | ICD-10-CM | POA: Diagnosis not present

## 2018-01-18 DIAGNOSIS — G43909 Migraine, unspecified, not intractable, without status migrainosus: Secondary | ICD-10-CM

## 2018-01-18 LAB — MICROSCOPIC EXAMINATION

## 2018-01-18 LAB — UA/M W/RFLX CULTURE, ROUTINE
BILIRUBIN UA: NEGATIVE
GLUCOSE, UA: NEGATIVE
Ketones, UA: NEGATIVE
NITRITE UA: NEGATIVE
PH UA: 7 (ref 5.0–7.5)
Protein, UA: NEGATIVE
Specific Gravity, UA: 1.02 (ref 1.005–1.030)
Urobilinogen, Ur: 0.2 mg/dL (ref 0.2–1.0)

## 2018-01-18 MED ORDER — CLOTRIMAZOLE-BETAMETHASONE 1-0.05 % EX CREA: 1.0000 "application " | TOPICAL_CREAM | Freq: Two times a day (BID) | CUTANEOUS | 0 refills | Status: DC

## 2018-01-18 NOTE — Progress Notes (Signed)
BP 113/74   Pulse 65   Temp 98.9 F (37.2 C) (Oral)   Ht 5' 5.5" (1.664 m)   Wt 176 lb 9.6 oz (80.1 kg)   SpO2 99%   BMI 28.94 kg/m    Subjective:    Patient ID: Sydney Horne, female    DOB: [birth year], 52 y.o.   MRN: [id number]  HPI: Sydney Horne is a 52 y.o. female  Chief Complaint  Patient presents with  . Annual Exam   Chief complaint today is foot dermatitis.  Noted cracking and pulling "sheets and chunks" of dry skin.  No itching.  States symptoms have been ongoing since July 4th.   Depression screen [facility] 2/9 01/18/2018 01/12/2017  Decreased Interest 0 0  Down, Depressed, Hopeless 0 1  PHQ - 2 Score 0 1  Altered sleeping 0 2  Tired, decreased energy 0 0  Change in appetite 0 0  Feeling bad or failure about yourself  0 0  Trouble concentrating 0 1  Moving slowly or fidgety/restless 0 0  Suicidal thoughts 0 0  PHQ-9 Score 0 4   Social History   Socioeconomic History  . Marital status: Married    Spouse name: Not on file  . Number of children: Not on file  . Years of education: Not on file  . Highest education level: Not on file  Occupational History  . Not on file  Social Needs  . Financial resource strain: Not on file  . Food insecurity:    Worry: Not on file    Inability: Not on file  . Transportation needs:    Medical: Not on file    Non-medical: Not on file  Tobacco Use  . Smoking status: Never Smoker  . Smokeless tobacco: Never Used  Substance and Sexual Activity  . Alcohol use: Yes    Alcohol/week: 0.0 standard drinks    Comment: 1 drink/mo  . Drug use: No  . Sexual activity: Yes    Birth control/protection: None  Lifestyle  . Physical activity:    Days per week: 2 days    Minutes per session: 50 min  . Stress: Not on file  Relationships  . Social connections:    Talks on phone: Not on file    Gets together: Not on file    Attends religious service: Not on file    Active member of club or organization: Not on file    Attends  meetings of clubs or organizations: Not on file    Relationship status: Not on file  . Intimate partner violence:    Fear of current or ex partner: Not on file    Emotionally abused: Not on file    Physically abused: Not on file    Forced sexual activity: Not on file  Other Topics Concern  . Not on file  Social History Narrative  . Not on file   Family History  Problem Relation Age of Onset  . Breast cancer Mother 29  . Cancer Mother        breast  . Cancer Father        prostate  . Breast cancer Sister 72  . Cancer Sister        breast  . Diabetes Sister   . Colon cancer Sister   . Breast cancer Maternal Aunt   . Breast cancer Paternal Aunt   . Breast cancer Paternal Grandmother   . Cancer Paternal Grandmother  breast  . Diabetes Maternal Grandmother   . Heart attack Maternal Grandfather   . Diabetes Sister   . Ovarian cancer Neg Hx    Past Medical History:  Diagnosis Date  . Environmental allergies   . Headache    2x/mo  . Heart murmur    followed by PCP  . Kidney stones   . Neck pain    sees chiropractor for adjustment when having problems  . Vertigo 2017   2 episodes   Past Surgical History:  Procedure Laterality Date  . bladder control surgery    . COLONOSCOPY WITH PROPOFOL N/A 11/27/2016   Procedure: COLONOSCOPY WITH PROPOFOL;  Surgeon: Lucilla Lame, MD;  Location: [facility];  Service: Endoscopy;  Laterality: N/A;      Relevant past medical, surgical, family and social history reviewed and updated as indicated. Interim medical history since our last visit reviewed. Allergies and medications reviewed and updated.  Review of Systems  Constitutional: Negative.   HENT:       Using Flonase only when she has a cold  Eyes: Negative.   Respiratory: Negative.   Cardiovascular: Negative.   Gastrointestinal: Negative.   Endocrine: Negative.   Genitourinary:       Using strogen vaginal cream twice a week  Musculoskeletal: Negative.     Skin: Negative.   Allergic/Immunologic: Negative.   Neurological: Negative.   Hematological: Negative.   Psychiatric/Behavioral: Negative.     Per HPI unless specifically indicated above     Objective:    BP 113/74   Pulse 65   Temp 98.9 F (37.2 C) (Oral)   Ht 5' 5.5" (1.664 m)   Wt 176 lb 9.6 oz (80.1 kg)   SpO2 99%   BMI 28.94 kg/m   Wt Readings from Last 3 Encounters:  01/18/18 176 lb 9.6 oz (80.1 kg)  09/13/17 183 lb 8 oz (83.2 kg)  01/12/17 178 lb 9.6 oz (81 kg)    Physical Exam  Constitutional: She is oriented to person, place, and time. She appears well-developed and well-nourished.  HENT:  Head: Normocephalic and atraumatic.  Eyes: Pupils are equal, round, and reactive to light. Right eye exhibits no discharge. Left eye exhibits no discharge. No scleral icterus.  Neck: Normal range of motion. Neck supple. Carotid bruit is not present. No thyromegaly present.  Cardiovascular: Normal rate, regular rhythm and normal heart sounds. Exam reveals no gallop and no friction rub.  No murmur heard. Pulmonary/Chest: Effort normal and breath sounds normal. No respiratory distress. She has no wheezes. She has no rales.  Abdominal: Soft. Bowel sounds are normal. There is no tenderness. There is no rebound.  Genitourinary: No breast tenderness or discharge.  Genitourinary Comments: Done through gyn  Musculoskeletal: Normal range of motion.  Lymphadenopathy:    She has no cervical adenopathy.  Neurological: She is alert and oriented to person, place, and time.  Skin: Skin is warm, dry and intact. No rash noted.  Psychiatric: She has a normal mood and affect. Her speech is normal and behavior is normal. Judgment and thought content normal. Cognition and memory are normal.   Breast exam done through gyn  Results for orders placed or performed in visit on 01/18/18  Microscopic Examination  Result Value Ref Range   WBC, UA 0-5 0 - 5 /hpf   RBC, UA 0-2 0 - 2 /hpf   Epithelial  Cells (non renal) CANCELED    Bacteria, UA Few None seen/Few  UA/M w/rflx Culture, Routine  Result  Value Ref Range   Specific Gravity, UA 1.020 1.005 - 1.030   pH, UA 7.0 5.0 - 7.5   Color, UA Yellow Yellow   Appearance Ur Hazy (A) Clear   Leukocytes, UA Trace (A) Negative   Protein, UA Negative Negative/Trace   Glucose, UA Negative Negative   Ketones, UA Negative Negative   RBC, UA Trace (A) Negative   Bilirubin, UA Negative Negative   Urobilinogen, Ur 0.2 0.2 - 1.0 mg/dL   Nitrite, UA Negative Negative   Microscopic Examination See below:       Assessment & Plan:   Problem List Items Addressed This Visit    None    Visit Diagnoses    Annual physical exam    -  Primary   Tinea pedis of both feet       Rx for Lotrisone to use BID   Relevant Medications   clotrimazole-betamethasone (LOTRISONE) cream      HM Mammogram and pelvic done through gynd  Follow up plan: No follow-ups on file.

## 2018-01-18 NOTE — Telephone Encounter (Signed)
Patient was scheduled to be seen for physical this morning. Due to schedule change with Dr. Julian Hy, DNP it was changed to this afternoon. Patient walked in to office for OV this a.m. Agreeable to return this afternoon, however, patient is currently fasting and would like to go ahead and have labs done while she is here. Spoke with Mrs. [name], PA-C about situation. She is agreeable. Labs were placed and patient was sent to lab.

## 2018-01-19 LAB — LIPID PANEL
CHOL/HDL RATIO: 2.7 ratio (ref 0.0–4.4)
CHOLESTEROL TOTAL: 158 mg/dL (ref 100–199)
HDL: 59 mg/dL (ref >39)
LDL CALC: 83 mg/dL (ref 0–99)
TRIGLYCERIDES: 80 mg/dL (ref 0–149)
VLDL Cholesterol Cal: 16 mg/dL (ref 5–40)

## 2018-01-19 LAB — CBC WITH DIFFERENTIAL/PLATELET
BASOS ABS: 0 10*3/uL (ref 0.0–0.2)
Basos: 1 %
EOS (ABSOLUTE): 0 10*3/uL (ref 0.0–0.4)
Eos: 1 %
Hematocrit: 42.4 % (ref 34.0–46.6)
Hemoglobin: 14.3 g/dL (ref 11.1–15.9)
Immature Grans (Abs): 0 10*3/uL (ref 0.0–0.1)
Immature Granulocytes: 0 %
Lymphocytes Absolute: 1.8 10*3/uL (ref 0.7–3.1)
Lymphs: 28 %
MCH: 29.9 pg (ref 26.6–33.0)
MCHC: 33.7 g/dL (ref 31.5–35.7)
MCV: 89 fL (ref 79–97)
MONOS ABS: 0.6 10*3/uL (ref 0.1–0.9)
Monocytes: 10 %
NEUTROS ABS: 3.9 10*3/uL (ref 1.4–7.0)
NEUTROS PCT: 60 %
PLATELETS: 193 10*3/uL (ref 150–450)
RBC: 4.79 x10E6/uL (ref 3.77–5.28)
RDW: 13.5 % (ref 12.3–15.4)
WBC: 6.4 10*3/uL (ref 3.4–10.8)

## 2018-01-19 LAB — COMPREHENSIVE METABOLIC PANEL
A/G RATIO: 2 (ref 1.2–2.2)
ALT: 26 IU/L (ref 0–32)
AST: 25 IU/L (ref 0–40)
Albumin: 4.6 g/dL (ref 3.5–5.5)
Alkaline Phosphatase: 79 IU/L (ref 39–117)
BILIRUBIN TOTAL: 0.5 mg/dL (ref 0.0–1.2)
BUN/Creatinine Ratio: 18 (ref 9–23)
BUN: 16 mg/dL (ref 6–24)
CALCIUM: 9.7 mg/dL (ref 8.7–10.2)
CHLORIDE: 101 mmol/L (ref 96–106)
CO2: 24 mmol/L (ref 20–29)
Creatinine, Ser: 0.9 mg/dL (ref 0.57–1.00)
GFR calc Af Amer: 84 mL/min/{1.73_m2} (ref >59)
GFR, EST NON AFRICAN AMERICAN: 73 mL/min/{1.73_m2} (ref >59)
GLOBULIN, TOTAL: 2.3 g/dL (ref 1.5–4.5)
Glucose: 89 mg/dL (ref 65–99)
POTASSIUM: 4.5 mmol/L (ref 3.5–5.2)
SODIUM: 143 mmol/L (ref 134–144)
Total Protein: 6.9 g/dL (ref 6.0–8.5)

## 2018-01-19 LAB — TSH: TSH: 2.47 u[IU]/mL (ref 0.450–4.500)

## 2018-01-21 ENCOUNTER — Encounter: Payer: Self-pay | Admitting: Family Medicine

## 2018-04-22 ENCOUNTER — Ambulatory Visit: Payer: 59 | Admitting: Family Medicine

## 2018-04-22 ENCOUNTER — Encounter: Payer: Self-pay | Admitting: Family Medicine

## 2018-04-22 VITALS — BP 145/79 | HR 68 | Temp 98.5°F | Wt 189.7 lb

## 2018-04-22 DIAGNOSIS — B353 Tinea pedis: Secondary | ICD-10-CM

## 2018-04-22 MED ORDER — TERBINAFINE HCL 1 % EX CREA: 1.0000 "application " | TOPICAL_CREAM | Freq: Two times a day (BID) | CUTANEOUS | 1 refills | Status: DC

## 2018-04-22 NOTE — Progress Notes (Signed)
BP (!) 145/79   Pulse 68   Temp 98.5 F (36.9 C) (Oral)   Wt 189 lb 11.2 oz (86 kg)   LMP  (LMP Unknown)   SpO2 99%   BMI 31.09 kg/m    Subjective:    Patient ID: Sydney Horne, female    DOB: [birth year], 52 y.o.   MRN: [id number]  HPI: Sydney Horne is a 52 y.o. female  Chief Complaint  Patient presents with  . Foot Problem    pt states the cream that Malachy Mood gave her for her feet (lotrisone) is not helping. States in between her toes is still peeling   Here today following up on peeling skin between 4th and 5th toes b/l feet the past 6+ months. Has tried lotrisone with initial improvement but sxs returned and are not improving on cream. No itching, redness, pain. Thinks this issue may have initially come from getting pedicures.   Relevant past medical, surgical, family and social history reviewed and updated as indicated. Interim medical history since our last visit reviewed. Allergies and medications reviewed and updated.  Review of Systems  Per HPI unless specifically indicated above     Objective:    BP (!) 145/79   Pulse 68   Temp 98.5 F (36.9 C) (Oral)   Wt 189 lb 11.2 oz (86 kg)   LMP  (LMP Unknown)   SpO2 99%   BMI 31.09 kg/m   Wt Readings from Last 3 Encounters:  04/22/18 189 lb 11.2 oz (86 kg)  01/18/18 176 lb 9.6 oz (80.1 kg)  09/13/17 183 lb 8 oz (83.2 kg)    Physical Exam  Constitutional: She is oriented to person, place, and time. She appears well-developed and well-nourished. No distress.  HENT:  Head: Atraumatic.  Eyes: Conjunctivae and EOM are normal.  Neck: Normal range of motion. Neck supple.  Cardiovascular: Normal rate, regular rhythm and normal heart sounds.  Pulmonary/Chest: Effort normal and breath sounds normal.  Musculoskeletal: Normal range of motion.  Neurological: She is alert and oriented to person, place, and time.  Skin: Skin is warm and dry. Rash (mild peeling between 4th and 5th toes b/l feet, no erythema, bleeding, ttp)  noted.  Psychiatric: She has a normal mood and affect. Her behavior is normal. Thought content normal.  Nursing note and vitals reviewed.   Results for orders placed or performed in visit on 01/18/18  Microscopic Examination  Result Value Ref Range   WBC, UA 0-5 0 - 5 /hpf   RBC, UA 0-2 0 - 2 /hpf   Epithelial Cells (non renal) CANCELED    Bacteria, UA Few None seen/Few  UA/M w/rflx Culture, Routine  Result Value Ref Range   Specific Gravity, UA 1.020 1.005 - 1.030   pH, UA 7.0 5.0 - 7.5   Color, UA Yellow Yellow   Appearance Ur Hazy (A) Clear   Leukocytes, UA Trace (A) Negative   Protein, UA Negative Negative/Trace   Glucose, UA Negative Negative   Ketones, UA Negative Negative   RBC, UA Trace (A) Negative   Bilirubin, UA Negative Negative   Urobilinogen, Ur 0.2 0.2 - 1.0 mg/dL   Nitrite, UA Negative Negative   Microscopic Examination See below:   TSH  Result Value Ref Range   TSH 2.470 0.450 - 4.500 uIU/mL  Lipid panel  Result Value Ref Range   Cholesterol, Total 158 100 - 199 mg/dL   Triglycerides 80 0 - 149 mg/dL   HDL 59 >39  mg/dL   VLDL Cholesterol Cal 16 5 - 40 mg/dL   LDL Calculated 83 0 - 99 mg/dL   Chol/HDL Ratio 2.7 0.0 - 4.4 ratio  Comprehensive metabolic panel  Result Value Ref Range   Glucose 89 65 - 99 mg/dL   BUN 16 6 - 24 mg/dL   Creatinine, Ser 0.90 0.57 - 1.00 mg/dL   GFR calc non Af Amer 73 >59 mL/min/1.73   GFR calc Af Amer 84 >59 mL/min/1.73   BUN/Creatinine Ratio 18 9 - 23   Sodium 143 134 - 144 mmol/L   Potassium 4.5 3.5 - 5.2 mmol/L   Chloride 101 96 - 106 mmol/L   CO2 24 20 - 29 mmol/L   Calcium 9.7 8.7 - 10.2 mg/dL   Total Protein 6.9 6.0 - 8.5 g/dL   Albumin 4.6 3.5 - 5.5 g/dL   Globulin, Total 2.3 1.5 - 4.5 g/dL   Albumin/Globulin Ratio 2.0 1.2 - 2.2   Bilirubin Total 0.5 0.0 - 1.2 mg/dL   Alkaline Phosphatase 79 39 - 117 IU/L   AST 25 0 - 40 IU/L   ALT 26 0 - 32 IU/L  CBC with Differential/Platelet  Result Value Ref Range    WBC 6.4 3.4 - 10.8 x10E3/uL   RBC 4.79 3.77 - 5.28 x10E6/uL   Hemoglobin 14.3 11.1 - 15.9 g/dL   Hematocrit 42.4 34.0 - 46.6 %   MCV 89 79 - 97 fL   MCH 29.9 26.6 - 33.0 pg   MCHC 33.7 31.5 - 35.7 g/dL   RDW 13.5 12.3 - 15.4 %   Platelets 193 150 - 450 x10E3/uL   Neutrophils 60 Not Estab. %   Lymphs 28 Not Estab. %   Monocytes 10 Not Estab. %   Eos 1 Not Estab. %   Basos 1 Not Estab. %   Neutrophils Absolute 3.9 1.4 - 7.0 x10E3/uL   Lymphocytes Absolute 1.8 0.7 - 3.1 x10E3/uL   Monocytes Absolute 0.6 0.1 - 0.9 x10E3/uL   EOS (ABSOLUTE) 0.0 0.0 - 0.4 x10E3/uL   Basophils Absolute 0.0 0.0 - 0.2 x10E3/uL   Immature Granulocytes 0 Not Estab. %   Immature Grans (Abs) 0.0 0.0 - 0.1 x10E3/uL      Assessment & Plan:   Problem List Items Addressed This Visit    None    Visit Diagnoses    Tinea pedis of both feet    -  Primary   D/C lotrisone, start lamisil cream and antifungal powder. Change shoes and socks frequently. F/u if not improving   Relevant Medications   terbinafine (LAMISIL) 1 % cream       Follow up plan: Return if symptoms worsen or fail to improve.

## 2018-09-17 ENCOUNTER — Encounter: Payer: 59 | Admitting: Obstetrics and Gynecology

## 2018-11-04 ENCOUNTER — Encounter: Payer: Self-pay | Admitting: Nurse Practitioner

## 2018-11-04 ENCOUNTER — Ambulatory Visit: Payer: 59 | Admitting: Nurse Practitioner

## 2018-11-04 ENCOUNTER — Other Ambulatory Visit: Payer: Self-pay

## 2018-11-04 VITALS — BP 144/72 | HR 71 | Temp 99.1°F | Wt 195.0 lb

## 2018-11-04 DIAGNOSIS — B351 Tinea unguium: Secondary | ICD-10-CM | POA: Diagnosis not present

## 2018-11-04 MED ORDER — CICLOPIROX 8 % EX SOLN: Freq: Every day | CUTANEOUS | 0 refills | Status: DC

## 2018-11-04 NOTE — Patient Instructions (Signed)
Pie de atleta  Athlete's Foot    El pie de atleta (tinea pedis) es una infeccin por hongos en la piel de los pies. Generalmente se produce en la piel que se encuentra entre los dedos o debajo de ellos. Tambin puede aparecer en la planta de los pies. Los sntomas incluyen picazn o zonas blancas y escamosas en la piel. La infeccin puede transmitirse de una persona a otra (es contagiosa). Tambin puede propagarse cuando los pies descalzos de una persona entran en contacto con el hongo que est en el piso de la ducha o sobre artculos como zapatos.  Siga estas indicaciones en su casa:  Medicamentos   Aplquese o tome los medicamentos de venta libre y los recetados solamente como se lo haya indicado el mdico.   Aplique o tome el medicamento antimictico como se lo haya indicado el mdico. No deje de usar el medicamento aunque comience a sentir que los pies estn mejor.  Cuidados de los pies   No se rasque los pies.   Mantenga los pies secos:  ? Use calcetines de algodn o lana. Cmbiese los calcetines todos los das o si se mojan.  ? Use un tipo de calzado que permita que el aire circule, como sandalias o zapatillas de lona.   Lvese y squese los pies:  ? Todos los das o como se lo haya indicado el mdico.  ? Despus de hacer actividad fsica.  ? Sin olvidar la zona entre los dedos.  Instrucciones generales   No comparta ninguno de los siguientes objetos que entren en contacto con los pies:  ? Toallas.  ? Calzado.  ? Alicates para las uas.  ? Otros objetos de uso personal.   Use sandalias cuando tenga que pisar zonas hmedas, como vestuarios y duchas compartidas.   Concurra a todas las visitas de seguimiento como se lo haya indicado el mdico. Esto es importante.   Si tiene diabetes, mantenga bajo control el nivel de azcar en la sangre.  Comunquese con un mdico si:   Tiene fiebre.   Aumenta la hinchazn, el dolor, el calor o el enrojecimiento en el pie.   Los pies no mejoran con el tratamiento.    Los sntomas empeoran.   Aparecen nuevos sntomas.  Resumen   El pie de atleta es una infeccin por hongos en la piel de los pies.   Los sntomas incluyen picazn o zonas blancas y escamosas en la piel.   Aplique o tome el medicamento antimictico como se lo haya indicado el mdico.   Mantenga los pies, limpios y secos.  Esta informacin no tiene como fin reemplazar el consejo del mdico. Asegrese de hacerle al mdico cualquier pregunta que tenga.  Document Released: 06/03/2010 Document Revised: 04/23/2017 Document Reviewed: 04/23/2017  Elsevier Interactive Patient Education  2019 [employer] Inc[employer]

## 2018-11-04 NOTE — Assessment & Plan Note (Signed)
Ongoing issue x one year.  Script for [employer].  Referral to podiatry placed for ongoing issues with minimal improvement.  Return for worsening or continued symptoms.

## 2018-11-04 NOTE — Progress Notes (Signed)
BP (!) 144/72   Pulse 71   Temp 99.1 F (37.3 C) (Oral)   Wt 195 lb (88.5 kg)   SpO2 96%   BMI 31.96 kg/m    Subjective:    Patient ID: Sydney Horne, female    DOB: [birth year], 53 y.o.   MRN: [id number]  HPI: Sydney Horne is a 53 y.o. female  Chief Complaint  Patient presents with  . Nail Problem   ONYCHOMYCOSIS: Started last summer, had cracking and peeling between toes.  Was started on antifungal at that time.  Initially was placed on Lotrisone, 01/18/18, and then returned to office at this did not work and was placed on Lamisil, which did work.  Now the peeling between toes has improved, but toenails are getting worse.  She denies burning, itching, or discomfort.  States her toenails are yellow and crusting, she feels one may come off.  Has not seen podiatry in past.  This is an ongoing, worsening issue since last summer.  Has tried multiple OTC medications.  Relevant past medical, surgical, family and social history reviewed and updated as indicated. Interim medical history since our last visit reviewed. Allergies and medications reviewed and updated.  Review of Systems  Constitutional: Negative for activity change, appetite change, diaphoresis, fatigue and fever.  Respiratory: Negative for cough, chest tightness and shortness of breath.   Cardiovascular: Negative for chest pain, palpitations and leg swelling.  Gastrointestinal: Negative for abdominal distention, abdominal pain, constipation, diarrhea, nausea and vomiting.  Psychiatric/Behavioral: Negative.     Per HPI unless specifically indicated above     Objective:    BP (!) 144/72   Pulse 71   Temp 99.1 F (37.3 C) (Oral)   Wt 195 lb (88.5 kg)   SpO2 96%   BMI 31.96 kg/m   Wt Readings from Last 3 Encounters:  11/04/18 195 lb (88.5 kg)  04/22/18 189 lb 11.2 oz (86 kg)  01/18/18 176 lb 9.6 oz (80.1 kg)    Physical Exam Vitals signs and nursing note reviewed.  Constitutional:      General: She is awake.  She is not in acute distress.    Appearance: She is well-developed. She is not ill-appearing.  HENT:     Head: Normocephalic.     Right Ear: Hearing normal.     Left Ear: Hearing normal.     Nose: Nose normal.     Mouth/Throat:     Mouth: Mucous membranes are moist.  Eyes:     General: Lids are normal.        Right eye: No discharge.        Left eye: No discharge.     Conjunctiva/sclera: Conjunctivae normal.     Pupils: Pupils are equal, round, and reactive to light.  Neck:     Musculoskeletal: Normal range of motion and neck supple.     Thyroid: No thyromegaly.     Vascular: No carotid bruit or JVD.  Cardiovascular:     Rate and Rhythm: Normal rate and regular rhythm.     Pulses:          Dorsalis pedis pulses are 2+ on the right side and 2+ on the left side.       Posterior tibial pulses are 2+ on the right side and 2+ on the left side.     Heart sounds: Normal heart sounds. No murmur. No gallop.   Pulmonary:     Effort: Pulmonary effort is normal. No accessory muscle  usage or respiratory distress.     Breath sounds: Normal breath sounds.  Abdominal:     General: Bowel sounds are normal.     Palpations: Abdomen is soft. There is no hepatomegaly or splenomegaly.  Musculoskeletal:     Right lower leg: No edema.     Left lower leg: No edema.  Feet:     Right foot:     Skin integrity: Skin integrity normal.     Toenail Condition: Fungal disease present.    Left foot:     Skin integrity: Skin integrity normal.     Toenail Condition: Fungal disease present.    Comments: Left foot 2nd to 5th (2nd and 3rd toe worse) with thick yellow discoloration and mild erythema around exterior with some flaking skin.  Right foot 5th toe with thick, yellow toenail. Lymphadenopathy:     Cervical: No cervical adenopathy.  Skin:    General: Skin is warm and dry.  Neurological:     Mental Status: She is alert and oriented to person, place, and time.  Psychiatric:        Attention and  Perception: Attention normal.        Mood and Affect: Mood normal.        Behavior: Behavior normal. Behavior is cooperative.        Thought Content: Thought content normal.        Judgment: Judgment normal.      Results for orders placed or performed in visit on 01/18/18  Microscopic Examination   URINE  Result Value Ref Range   WBC, UA 0-5 0 - 5 /hpf   RBC, UA 0-2 0 - 2 /hpf   Epithelial Cells (non renal) CANCELED    Bacteria, UA Few None seen/Few  UA/M w/rflx Culture, Routine   Specimen: Urine   URINE  Result Value Ref Range   Specific Gravity, UA 1.020 1.005 - 1.030   pH, UA 7.0 5.0 - 7.5   Color, UA Yellow Yellow   Appearance Ur Hazy (A) Clear   Leukocytes, UA Trace (A) Negative   Protein, UA Negative Negative/Trace   Glucose, UA Negative Negative   Ketones, UA Negative Negative   RBC, UA Trace (A) Negative   Bilirubin, UA Negative Negative   Urobilinogen, Ur 0.2 0.2 - 1.0 mg/dL   Nitrite, UA Negative Negative   Microscopic Examination See below:   TSH  Result Value Ref Range   TSH 2.470 0.450 - 4.500 uIU/mL  Lipid panel  Result Value Ref Range   Cholesterol, Total 158 100 - 199 mg/dL   Triglycerides 80 0 - 149 mg/dL   HDL 59 >39 mg/dL   VLDL Cholesterol Cal 16 5 - 40 mg/dL   LDL Calculated 83 0 - 99 mg/dL   Chol/HDL Ratio 2.7 0.0 - 4.4 ratio  Comprehensive metabolic panel  Result Value Ref Range   Glucose 89 65 - 99 mg/dL   BUN 16 6 - 24 mg/dL   Creatinine, Ser 0.90 0.57 - 1.00 mg/dL   GFR calc non Af Amer 73 >59 mL/min/1.73   GFR calc Af Amer 84 >59 mL/min/1.73   BUN/Creatinine Ratio 18 9 - 23   Sodium 143 134 - 144 mmol/L   Potassium 4.5 3.5 - 5.2 mmol/L   Chloride 101 96 - 106 mmol/L   CO2 24 20 - 29 mmol/L   Calcium 9.7 8.7 - 10.2 mg/dL   Total Protein 6.9 6.0 - 8.5 g/dL   Albumin 4.6 3.5 - 5.5  g/dL   Globulin, Total 2.3 1.5 - 4.5 g/dL   Albumin/Globulin Ratio 2.0 1.2 - 2.2   Bilirubin Total 0.5 0.0 - 1.2 mg/dL   Alkaline Phosphatase 79 39 - 117  IU/L   AST 25 0 - 40 IU/L   ALT 26 0 - 32 IU/L  CBC with Differential/Platelet  Result Value Ref Range   WBC 6.4 3.4 - 10.8 x10E3/uL   RBC 4.79 3.77 - 5.28 x10E6/uL   Hemoglobin 14.3 11.1 - 15.9 g/dL   Hematocrit 42.4 34.0 - 46.6 %   MCV 89 79 - 97 fL   MCH 29.9 26.6 - 33.0 pg   MCHC 33.7 31.5 - 35.7 g/dL   RDW 13.5 12.3 - 15.4 %   Platelets 193 150 - 450 x10E3/uL   Neutrophils 60 Not Estab. %   Lymphs 28 Not Estab. %   Monocytes 10 Not Estab. %   Eos 1 Not Estab. %   Basos 1 Not Estab. %   Neutrophils Absolute 3.9 1.4 - 7.0 x10E3/uL   Lymphocytes Absolute 1.8 0.7 - 3.1 x10E3/uL   Monocytes Absolute 0.6 0.1 - 0.9 x10E3/uL   EOS (ABSOLUTE) 0.0 0.0 - 0.4 x10E3/uL   Basophils Absolute 0.0 0.0 - 0.2 x10E3/uL   Immature Granulocytes 0 Not Estab. %   Immature Grans (Abs) 0.0 0.0 - 0.1 x10E3/uL      Assessment & Plan:   Problem List Items Addressed This Visit      Musculoskeletal and Integument   Onychomycosis - Primary    Ongoing issue x one year.  Script for [employer].  Referral to podiatry placed for ongoing issues with minimal improvement.  Return for worsening or continued symptoms.      Relevant Medications   ciclopirox (PENLAC) 8 % solution   Other Relevant Orders   Ambulatory referral to Podiatry   Comprehensive metabolic panel       Follow up plan: Return in about 3 months (around 02/04/2019) for annual physical.

## 2018-11-05 ENCOUNTER — Telehealth: Payer: Self-pay

## 2018-11-05 LAB — COMPREHENSIVE METABOLIC PANEL
ALT: 19 IU/L (ref 0–32)
AST: 17 IU/L (ref 0–40)
Albumin/Globulin Ratio: 2.4 — ABNORMAL HIGH (ref 1.2–2.2)
Albumin: 4.5 g/dL (ref 3.8–4.9)
Alkaline Phosphatase: 67 IU/L (ref 39–117)
BUN/Creatinine Ratio: 18 (ref 9–23)
BUN: 15 mg/dL (ref 6–24)
Bilirubin Total: 0.3 mg/dL (ref 0.0–1.2)
CO2: 23 mmol/L (ref 20–29)
Calcium: 9.3 mg/dL (ref 8.7–10.2)
Chloride: 105 mmol/L (ref 96–106)
Creatinine, Ser: 0.82 mg/dL (ref 0.57–1.00)
GFR calc Af Amer: 94 mL/min/{1.73_m2} (ref >59)
GFR calc non Af Amer: 81 mL/min/{1.73_m2} (ref >59)
Globulin, Total: 1.9 g/dL (ref 1.5–4.5)
Glucose: 90 mg/dL (ref 65–99)
Potassium: 4 mmol/L (ref 3.5–5.2)
Sodium: 140 mmol/L (ref 134–144)
Total Protein: 6.4 g/dL (ref 6.0–8.5)

## 2018-11-05 NOTE — Progress Notes (Signed)
Normal test results noted.  Please call patient and make them aware of normal results and will continue to monitor at regular visits.  Have a great day.  Look forward to seeing you at your next visit.

## 2018-11-05 NOTE — Telephone Encounter (Signed)
Patient wanted to let you that she was able to use [facility] for the ciclopirox, and it saved her $60.00.

## 2018-11-05 NOTE — Telephone Encounter (Signed)
Wonderful. Thank you.

## 2018-11-22 ENCOUNTER — Encounter: Payer: Self-pay | Admitting: Obstetrics and Gynecology

## 2018-11-22 ENCOUNTER — Ambulatory Visit (INDEPENDENT_AMBULATORY_CARE_PROVIDER_SITE_OTHER): Payer: 59 | Admitting: Obstetrics and Gynecology

## 2018-11-22 ENCOUNTER — Other Ambulatory Visit: Payer: Self-pay

## 2018-11-22 ENCOUNTER — Other Ambulatory Visit (HOSPITAL_COMMUNITY)
Admission: RE | Admit: 2018-11-22 | Discharge: 2018-11-22 | Disposition: A | Discharge status: Home / Self Care | Payer: 59 | Source: Ambulatory Visit | Attending: Obstetrics and Gynecology | Admitting: Obstetrics and Gynecology

## 2018-11-22 VITALS — BP 102/60 | HR 79 | Ht 66.0 in | Wt 192.9 lb

## 2018-11-22 DIAGNOSIS — Z01419 Encounter for gynecological examination (general) (routine) without abnormal findings: Secondary | ICD-10-CM

## 2018-11-22 DIAGNOSIS — E669 Obesity, unspecified: Secondary | ICD-10-CM | POA: Diagnosis not present

## 2018-11-22 DIAGNOSIS — Z124 Encounter for screening for malignant neoplasm of cervix: Secondary | ICD-10-CM

## 2018-11-22 DIAGNOSIS — N898 Other specified noninflammatory disorders of vagina: Secondary | ICD-10-CM

## 2018-11-22 DIAGNOSIS — Z1239 Encounter for other screening for malignant neoplasm of breast: Secondary | ICD-10-CM

## 2018-11-22 DIAGNOSIS — Z800 Family history of malignant neoplasm of digestive organs: Secondary | ICD-10-CM

## 2018-11-22 MED ORDER — ESTRADIOL 0.1 MG/GM VA CREA: TOPICAL_CREAM | VAGINAL | 3 refills | Status: DC

## 2018-11-22 NOTE — Progress Notes (Signed)
GYNECOLOGY ANNUAL PHYSICAL EXAM PROGRESS NOTE  Subjective:    Sydney Horne is a 53 y.o. G73P2002 female who presents for an annual exam. The patient wears seatbelts: yes. The patient participates in regular exercise: no. Has the patient ever been transfused or tattooed?: no. The patient reports that there is not domestic violence in her life.   The patient has the following complaints today:  1. Patient complains of increased vaginal moisture that is causing irritation. Denies vaginal itching or burning.   Gynecologic History  Menarche age: 27 Patient's last menstrual period was 11/10/2018. Contraception: none History of STI's: Denies Last Pap: 08/2014. Results were: normal.  Denies h/o abnormal pap smears. Last mammogram: 10/16/2017. Results were: normal Last colonoscopy: 11/2016. Normal. To repeat in 10 years.   OB History  Gravida Para Term Preterm AB Living  2 2 2  0 0 2  SAB TAB Ectopic Multiple Live Births  0 0 0 0 2    # Outcome Date GA Lbr Len/2nd Weight Sex Delivery Anes PTL Lv  2 Term 1999   8 lb 12.8 oz (3.992 kg) M Vag-Spont   LIV  1 Term 1997   8 lb 1.6 oz (3.674 kg) F Vag-Spont   LIV    Past Medical History:  Diagnosis Date  . Environmental allergies   . Headache    2x/mo  . Heart murmur    followed by PCP  . Kidney stones   . Neck pain    sees chiropractor for adjustment when having problems  . Vertigo 2017   2 episodes    Past Surgical History:  Procedure Laterality Date  . bladder control surgery    . COLONOSCOPY WITH PROPOFOL N/A 11/27/2016   Procedure: COLONOSCOPY WITH PROPOFOL;  Surgeon: Lucilla Lame, MD;  Location: [facility];  Service: Endoscopy;  Laterality: N/A;    Family History  Problem Relation Age of Onset  . Breast cancer Mother 35  . Cancer Mother        breast  . Cancer Father        prostate  . Breast cancer Sister 28  . Cancer Sister        breast  . Diabetes Sister   . Colon cancer Sister   . Breast cancer  Maternal Aunt   . Breast cancer Paternal Aunt   . Breast cancer Paternal Grandmother   . Cancer Paternal Grandmother        breast  . Diabetes Maternal Grandmother   . Heart attack Maternal Grandfather   . Diabetes Sister   . Ovarian cancer Neg Hx     Social History   Socioeconomic History  . Marital status: Married    Spouse name: Not on file  . Number of children: Not on file  . Years of education: Not on file  . Highest education level: Not on file  Occupational History  . Not on file  Social Needs  . Financial resource strain: Not on file  . Food insecurity    Worry: Not on file    Inability: Not on file  . Transportation needs    Medical: Not on file    Non-medical: Not on file  Tobacco Use  . Smoking status: Never Smoker  . Smokeless tobacco: Never Used  Substance and Sexual Activity  . Alcohol use: Yes    Alcohol/week: 0.0 standard drinks    Comment: 1 drink/mo  . Drug use: No  . Sexual activity: Yes  Birth control/protection: None  Lifestyle  . Physical activity    Days per week: 2 days    Minutes per session: 50 min  . Stress: Not on file  Relationships  . Social Herbalist on phone: Not on file    Gets together: Not on file    Attends religious service: Not on file    Active member of club or organization: Not on file    Attends meetings of clubs or organizations: Not on file    Relationship status: Not on file  . Intimate partner violence    Fear of current or ex partner: Not on file    Emotionally abused: Not on file    Physically abused: Not on file    Forced sexual activity: Not on file  Other Topics Concern  . Not on file  Social History Narrative  . Not on file    Current Outpatient Medications on File Prior to Visit  Medication Sig Dispense Refill  . calcium citrate-vitamin D (CITRACAL+D) 315-200 MG-UNIT tablet Take 1 tablet by mouth daily.    . ciclopirox (PENLAC) 8 % solution Apply topically at bedtime. Apply over nail  and surrounding skin. Apply daily over previous coat. After seven (7) days, may remove with alcohol and continue cycle. 6.6 mL 0  . clotrimazole-betamethasone (LOTRISONE) cream Apply 1 application topically 2 (two) times daily. 30 g 0  . Cranberry (THERACRAN ONE PO) Take 1 tablet by mouth daily.    . fluticasone (FLONASE) 50 MCG/ACT nasal spray Place 2 sprays into both nostrils as needed.     Marland Kitchen ibuprofen (ADVIL,MOTRIN) 200 MG tablet Take 200 mg by mouth every 6 (six) hours as needed for headache.    . Multiple Vitamin (MULTIVITAMIN) tablet Take 1 tablet by mouth daily.    Marland Kitchen pyridoxine (B-6) 100 MG tablet Take 200 mg by mouth daily.    Marland Kitchen terbinafine (LAMISIL) 1 % cream Apply 1 application topically 2 (two) times daily. (Patient not taking: Reported on 11/04/2018) 90 g 1   No current facility-administered medications on file prior to visit.     Allergies  Allergen Reactions  . Other Nausea And Vomiting    sauerkraut  . Rye Grass Flower Pollen Extract [Gramineae Pollens] Nausea And Vomiting    Rye - food product  . Zithromax [Azithromycin] Diarrhea       . Adhesive [Tape] Rash    Band-aids      Review of Systems Constitutional: negative for chills, fatigue, fevers and sweats Eyes: negative for irritation, redness and visual disturbance Ears, nose, mouth, throat, and face: negative for hearing loss, nasal congestion, snoring and tinnitus Respiratory: negative for asthma, cough, sputum Cardiovascular: negative for chest pain, dyspnea, exertional chest pressure/discomfort, irregular heart beat, palpitations and syncope Gastrointestinal: negative for abdominal pain, change in bowel habits, nausea and vomiting Genitourinary: negative for abnormal menstrual periods, genital lesions, sexual problems and vaginal discharge, dysuria and urinary incontinence Integument/breast: negative for breast lump, breast tenderness and nipple discharge Hematologic/lymphatic: negative for bleeding and easy  bruising Musculoskeletal:negative for back pain and muscle weakness Neurological: negative for dizziness, headaches, vertigo and weakness Endocrine: negative for diabetic symptoms including polydipsia, polyuria and skin dryness Allergic/Immunologic: negative for hay fever and urticaria        Objective:  Blood pressure 102/60, pulse 79, height 5\' 6"  (1.676 m), weight 192 lb 14.4 oz (87.5 kg), last menstrual period 11/10/2018. Body mass index is 31.13 kg/m.     General Appearance:  Alert, cooperative, no distress, appears stated age, mild obesity  Head:    Normocephalic, without obvious abnormality, atraumatic  Eyes:    PERRL, conjunctiva/corneas clear, EOM's intact, both eyes  Ears:    Normal external ear canals, both ears  Nose:   Nares normal, septum midline, mucosa normal, no drainage or sinus tenderness  Throat:   Lips, mucosa, and tongue normal; teeth and gums normal  Neck:   Supple, symmetrical, trachea midline, no adenopathy; thyroid: no enlargement/tenderness/nodules; no carotid bruit or JVD  Back:     Symmetric, no curvature, ROM normal, no CVA tenderness  Lungs:     Clear to auscultation bilaterally, respirations unlabored  Chest Wall:    No tenderness or deformity   Heart:    Regular rate and rhythm, S1 and S2 normal, no murmur, rub or gallop  Breast Exam:    No tenderness, masses, or nipple abnormality  Abdomen:     Soft, non-tender, bowel sounds active all four quadrants, no masses, no organomegaly.    Genitalia:    Pelvic:external genitalia normal, vagina without lesions, discharge, or tenderness. Good estrogen effect noted.  Rectovaginal septum  normal. Cervix normal in appearance, no cervical motion tenderness, no adnexal masses or tenderness.  Uterus normal size, shape, mobile, regular contours, nontender.  Rectal:    Normal external sphincter.  No hemorrhoids appreciated. Internal exam not done.   Extremities:   Extremities normal, atraumatic, no cyanosis or edema   Pulses:   2+ and symmetric all extremities  Skin:   Skin color, texture, turgor normal, no rashes or lesions  Lymph nodes:   Cervical, supraclavicular, and axillary nodes normal  Neurologic:   CNII-XII intact, normal strength, sensation and reflexes throughout   .  Labs:  Lab Results  Component Value Date   WBC 6.4 01/18/2018   HGB 14.3 01/18/2018   HCT 42.4 01/18/2018   MCV 89 01/18/2018   PLT 193 01/18/2018    Lab Results  Component Value Date   CREATININE 0.82 11/04/2018   BUN 15 11/04/2018   NA 140 11/04/2018   K 4.0 11/04/2018   CL 105 11/04/2018   CO2 23 11/04/2018    Lab Results  Component Value Date   ALT 19 11/04/2018   AST 17 11/04/2018   ALKPHOS 67 11/04/2018   BILITOT 0.3 11/04/2018    Lab Results  Component Value Date   TSH 2.470 01/18/2018     Microscopic wet-mount exam shows few clue cells, no hyphae, no trichomonads, no white blood cells. KOH done.     Assessment:    Healthy female exam.   Family history of breast cancer  Family history of colon cancer  Mild obesity (BMI 31)  Vaginal atrophy  Plan:     Blood tests: Labs reviewed, wnl. Breast self exam technique reviewed and patient encouraged to perform self-exam monthly. Contraception: none. Discussed healthy lifestyle modifications. Mammogram ordered.  Colon cancer screening up to date.  Calcium with vitamin D supplementation 1200 mg daily Vaginal discharge present, likely an increase in leukorrhea due to use of estrogen. Given reassurance.  Pap smear performed today.   Follow up in 1 year with annual exam.     Rubie Maid, MD [facility]

## 2018-11-22 NOTE — Patient Instructions (Addendum)
Health Maintenance, Female Adopting a healthy lifestyle and getting preventive care are important in promoting health and wellness. Ask your health care provider about:  The right schedule for you to have regular tests and exams.  Things you can do on your own to prevent diseases and keep yourself healthy. What should I know about diet, weight, and exercise? Eat a healthy diet   Eat a diet that includes plenty of vegetables, fruits, low-fat dairy products, and lean protein.  Do not eat a lot of foods that are high in solid fats, added sugars, or sodium. Maintain a healthy weight Body mass index (BMI) is used to identify weight problems. It estimates body fat based on height and weight. Your health care provider can help determine your BMI and help you achieve or maintain a healthy weight. Get regular exercise Get regular exercise. This is one of the most important things you can do for your health. Most adults should:  Exercise for at least 150 minutes each week. The exercise should increase your heart rate and make you sweat (moderate-intensity exercise).  Do strengthening exercises at least twice a week. This is in addition to the moderate-intensity exercise.  Spend less time sitting. Even light physical activity can be beneficial. Watch cholesterol and blood lipids Have your blood tested for lipids and cholesterol at 54 years of age, then have this test every 5 years. Have your cholesterol levels checked more often if:  Your lipid or cholesterol levels are high.  You are older than 54 years of age.  You are at high risk for heart disease. What should I know about cancer screening? Depending on your health history and family history, you may need to have cancer screening at various ages. This may include screening for:  Breast cancer.  Cervical cancer.  Colorectal cancer.  Skin cancer.  Lung cancer. What should I know about heart disease, diabetes, and high blood  pressure? Blood pressure and heart disease  High blood pressure causes heart disease and increases the risk of stroke. This is more likely to develop in people who have high blood pressure readings, are of African descent, or are overweight.  Have your blood pressure checked: ? Every 3-5 years if you are 18-39 years of age. ? Every year if you are 40 years old or older. Diabetes Have regular diabetes screenings. This checks your fasting blood sugar level. Have the screening done:  Once every three years after age 40 if you are at a normal weight and have a low risk for diabetes.  More often and at a younger age if you are overweight or have a high risk for diabetes. What should I know about preventing infection? Hepatitis B If you have a higher risk for hepatitis B, you should be screened for this virus. Talk with your health care provider to find out if you are at risk for hepatitis B infection. Hepatitis C Testing is recommended for:  Everyone born from 1945 through 1965.  Anyone with known risk factors for hepatitis C. Sexually transmitted infections (STIs)  Get screened for STIs, including gonorrhea and chlamydia, if: ? You are sexually active and are younger than 54 years of age. ? You are older than 54 years of age and your health care provider tells you that you are at risk for this type of infection. ? Your sexual activity has changed since you were last screened, and you are at increased risk for chlamydia or gonorrhea. Ask your health care provider if   you are at risk.  Ask your health care provider about whether you are at high risk for HIV. Your health care provider may recommend a prescription medicine to help prevent HIV infection. If you choose to take medicine to prevent HIV, you should first get tested for HIV. You should then be tested every 3 months for as long as you are taking the medicine. Pregnancy  If you are about to stop having your period (premenopausal) and  you may become pregnant, seek counseling before you get pregnant.  Take 400 to 800 micrograms (mcg) of folic acid every day if you become pregnant.  Ask for birth control (contraception) if you want to prevent pregnancy. Osteoporosis and menopause Osteoporosis is a disease in which the bones lose minerals and strength with aging. This can result in bone fractures. If you are 65 years old or older, or if you are at risk for osteoporosis and fractures, ask your health care provider if you should:  Be screened for bone loss.  Take a calcium or vitamin D supplement to lower your risk of fractures.  Be given hormone replacement therapy (HRT) to treat symptoms of menopause. Follow these instructions at home: Lifestyle  Do not use any products that contain nicotine or tobacco, such as cigarettes, e-cigarettes, and chewing tobacco. If you need help quitting, ask your health care provider.  Do not use street drugs.  Do not share needles.  Ask your health care provider for help if you need support or information about quitting drugs. Alcohol use  Do not drink alcohol if: ? Your health care provider tells you not to drink. ? You are pregnant, may be pregnant, or are planning to become pregnant.  If you drink alcohol: ? Limit how much you use to 0-1 drink a day. ? Limit intake if you are breastfeeding.  Be aware of how much alcohol is in your drink. In the [place] one drink equals one 12 oz bottle of beer (355 mL), one 5 oz glass of wine (148 mL), or one 1 oz glass of hard liquor (44 mL). General instructions  Schedule regular health, dental, and eye exams.  Stay current with your vaccines.  Tell your health care provider if: ? You often feel depressed. ? You have ever been abused or do not feel safe at home. Summary  Adopting a healthy lifestyle and getting preventive care are important in promoting health and wellness.  Follow your health care provider's instructions about healthy  diet, exercising, and getting tested or screened for diseases.  Follow your health care provider's instructions on monitoring your cholesterol and blood pressure. This information is not intended to replace advice given to you by your health care provider. Make sure you discuss any questions you have with your health care provider. Document Released: 11/14/2010 Document Revised: 04/24/2018 Document Reviewed: 04/24/2018 Elsevier Patient Education  2020 [employer]  

## 2018-11-22 NOTE — Progress Notes (Signed)
Patient comes in today for her yearly exam. She has her labs through PCP. She is due for PAP and mammogram. Orders placed. She is having moisture in the vaginal area that is causing irritation.

## 2018-11-24 ENCOUNTER — Encounter: Payer: Self-pay | Admitting: Obstetrics and Gynecology

## 2018-11-26 LAB — CYTOLOGY - PAP
Adequacy: ABSENT
Diagnosis: NEGATIVE
HPV: NOT DETECTED

## 2018-11-29 ENCOUNTER — Ambulatory Visit (INDEPENDENT_AMBULATORY_CARE_PROVIDER_SITE_OTHER): Payer: 59 | Admitting: Podiatry

## 2018-11-29 ENCOUNTER — Other Ambulatory Visit: Payer: Self-pay | Admitting: Obstetrics and Gynecology

## 2018-11-29 ENCOUNTER — Encounter: Payer: Self-pay | Admitting: Podiatry

## 2018-11-29 ENCOUNTER — Other Ambulatory Visit: Payer: Self-pay

## 2018-11-29 VITALS — Temp 98.7°F

## 2018-11-29 DIAGNOSIS — B351 Tinea unguium: Secondary | ICD-10-CM

## 2018-11-29 DIAGNOSIS — Z1231 Encounter for screening mammogram for malignant neoplasm of breast: Secondary | ICD-10-CM

## 2018-11-29 MED ORDER — TERBINAFINE HCL 250 MG PO TABS: 250.0000 mg | ORAL_TABLET | Freq: Every day | ORAL | 0 refills | Status: DC

## 2018-12-02 NOTE — Progress Notes (Signed)
   Subjective: 53 y.o. female presenting today as a new patient with a chief complaint of possible nail fungus of some of his toenails that has been present for the past several years. He states the nails are starting to grow away from the nail bed. She has used OTC fungal medications and lacquers. She denies pain or modifying factors. Patient is here for further evaluation and treatment.   Past Medical History:  Diagnosis Date  . Environmental allergies   . Headache    2x/mo  . Heart murmur    followed by PCP  . Kidney stones   . Neck pain    sees chiropractor for adjustment when having problems  . Vertigo 2017   2 episodes    Objective: Physical Exam General: The patient is alert and oriented x3 in no acute distress.  Dermatology: Hyperkeratotic, discolored, thickened, onychodystrophy of nails 2 & 3 of the left foot and nail 5 of the right foot. Skin is warm, dry and supple bilateral lower extremities. Negative for open lesions or macerations.  Vascular: Palpable pedal pulses bilaterally. No edema or erythema noted. Capillary refill within normal limits.  Neurological: Epicritic and protective threshold grossly intact bilaterally.   Musculoskeletal Exam: Range of motion within normal limits to all pedal and ankle joints bilateral. Muscle strength 5/5 in all groups bilateral.   Assessment: #1 Onychomycosis nails 2, 3 left; 5 right #2 Hyperkeratotic nails 2, 3 left; 5 right   Plan of Care:  #1 Patient was evaluated. #2 Prescription for Lamisil 250 mg #90 provided to patient.  #3 Patient recently has LFT done and it was within normal limits.  #4 Continue topical antifungal medications as needed.  #5 Return to clinic as needed.    Edrick Kins, DPM [facility]  Dr. Edrick Kins, [address]                                        [address], [place] [address]                Office [phone]  Fax [phone]

## 2019-01-03 ENCOUNTER — Ambulatory Visit
Admission: RE | Admit: 2019-01-03 | Discharge: 2019-01-03 | Disposition: A | Discharge status: Home / Self Care | Payer: 59 | Source: Ambulatory Visit | Attending: Obstetrics and Gynecology | Admitting: Obstetrics and Gynecology

## 2019-01-03 DIAGNOSIS — Z1231 Encounter for screening mammogram for malignant neoplasm of breast: Secondary | ICD-10-CM | POA: Diagnosis present

## 2019-01-09 IMAGING — MG MM DIGITAL DIAGNOSTIC UNILAT*L* W/ TOMO W/ CAD
8 of 12 series · 8 of 24 positions shown · non-contrast
Comparison: Previous exam(s).

CLINICAL DATA: Patient presents for additional views of the left
breast as followup to a recent screening exam to evaluate a possible
mass. Strong family history of breast cancer in her mother diagnosed
at age 55, sister diagnosed at age 52, maternal and paternal aunts
and paternal grandmother.

EXAM:
2D DIGITAL DIAGNOSTIC left MAMMOGRAM WITH CAD AND ADJUNCT TOMO
ULTRASOUND left BREAST

[L XCCL (1 of 2)]
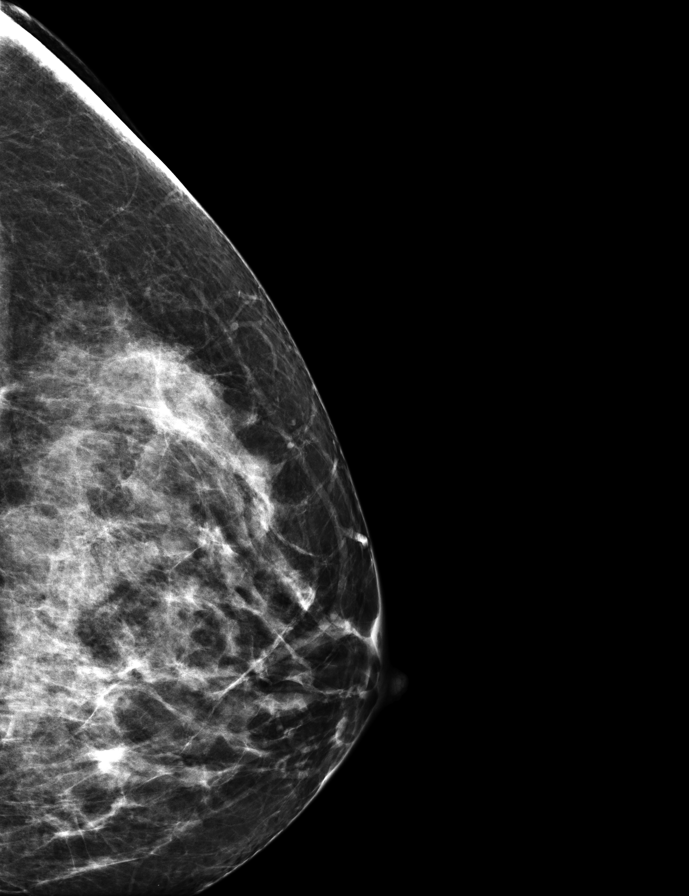

[L ML (1 of 2)]
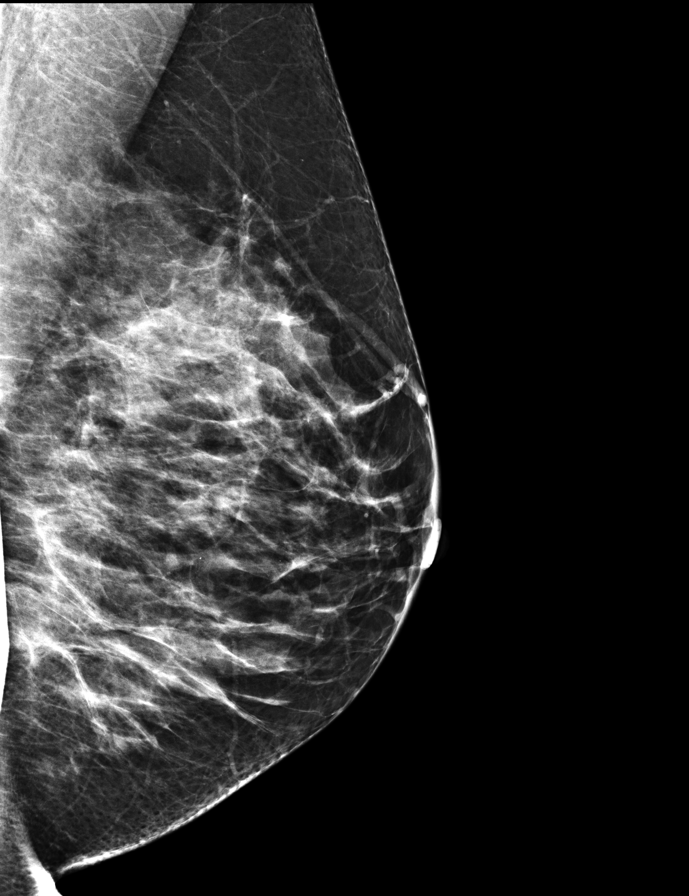

[L MLO]
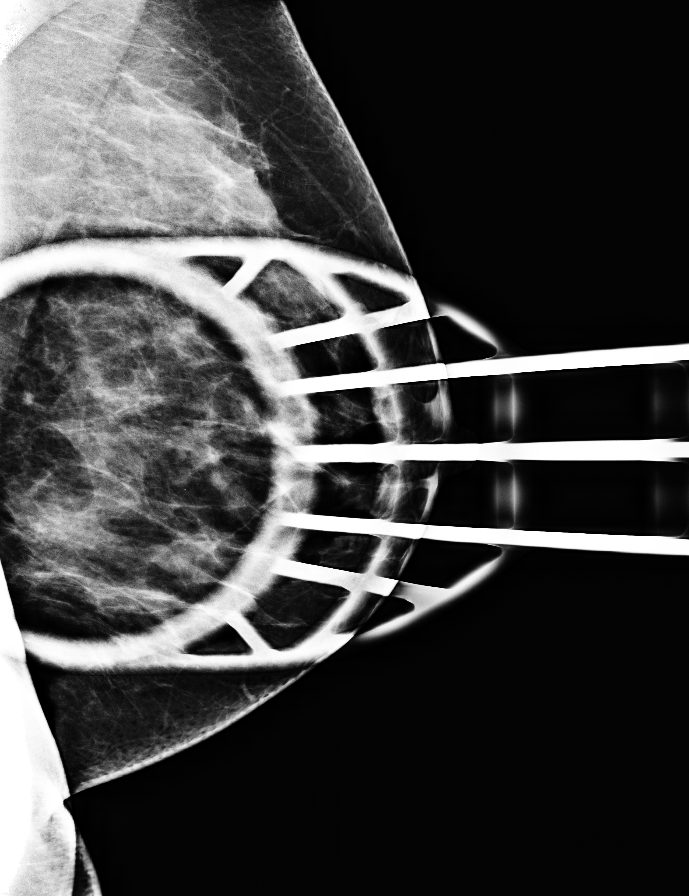

[L ML synth-2D]
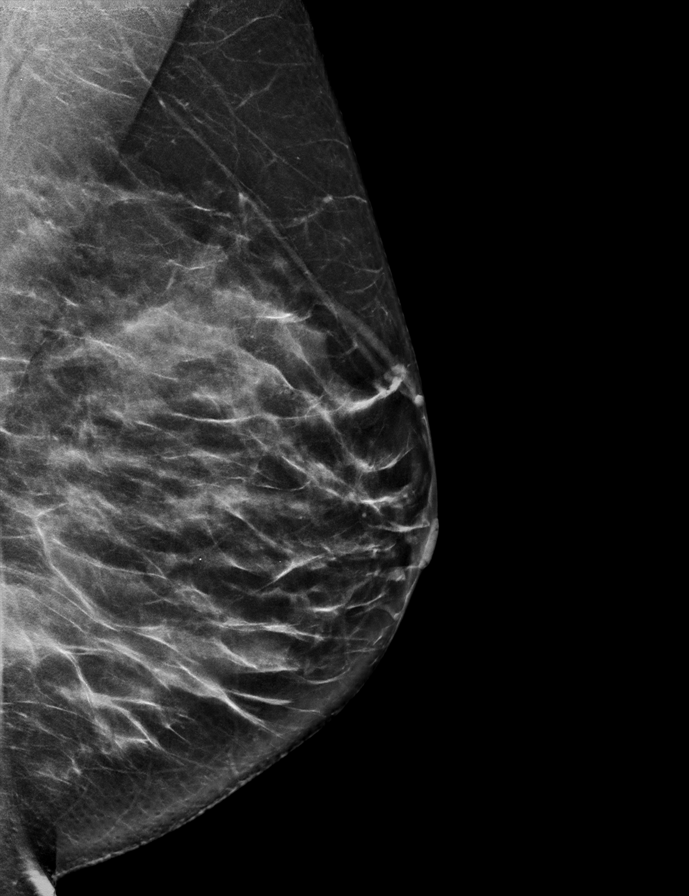

[L XCCL synth-2D]
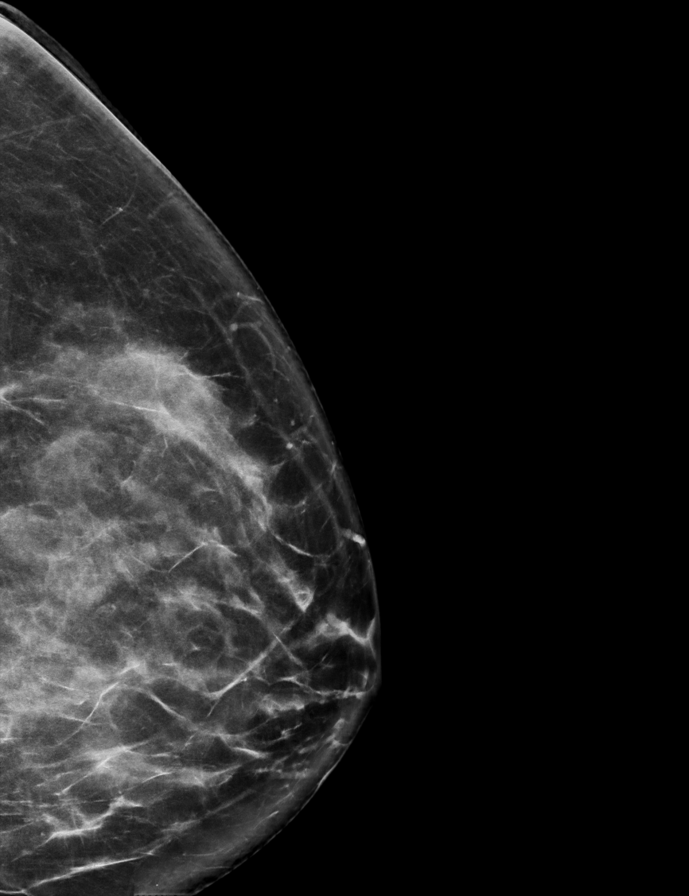

[L MLO synth-2D]
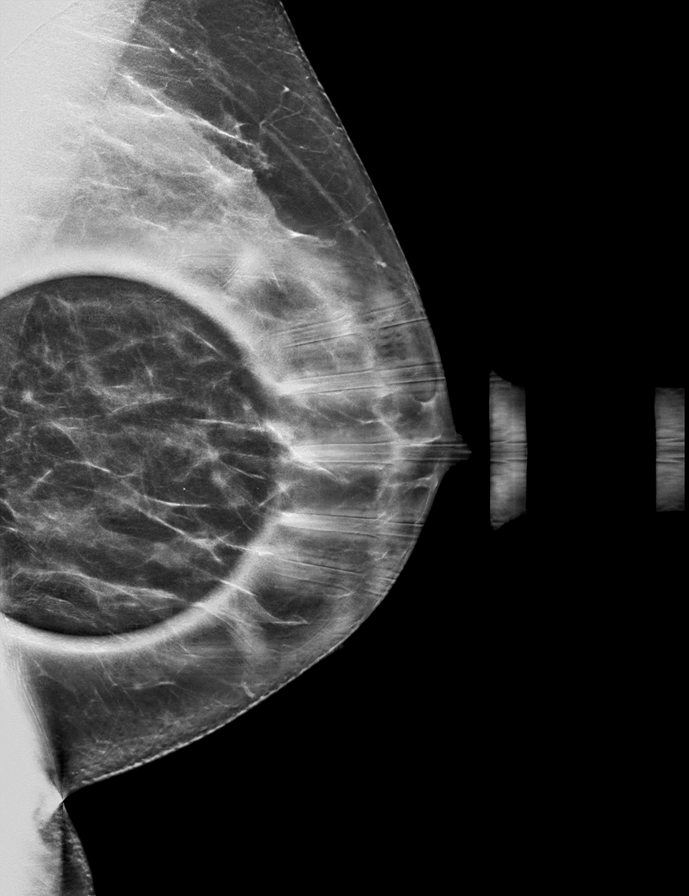

[L XCCL (2 of 2)]
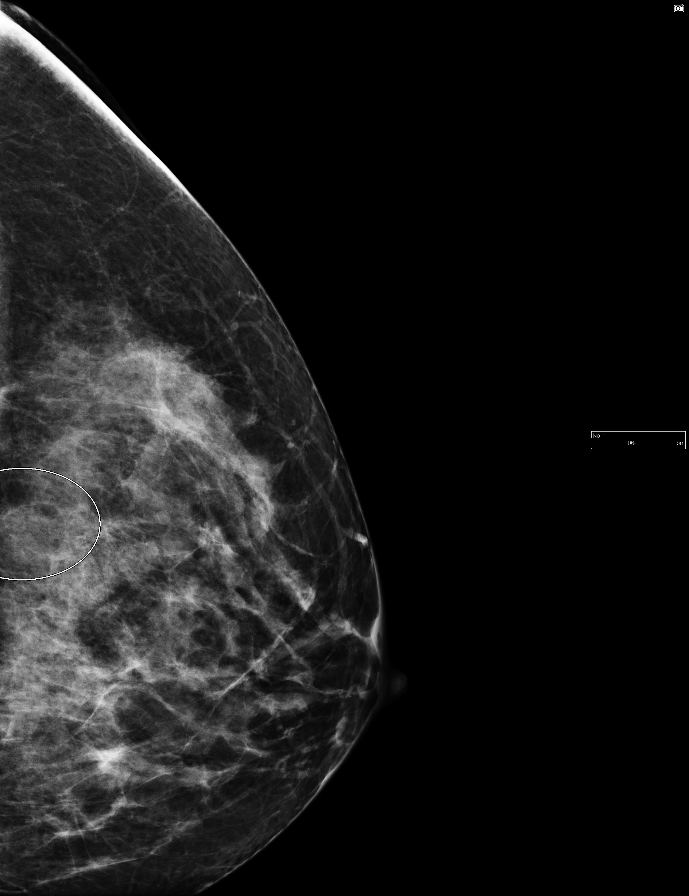

[L ML (2 of 2)]
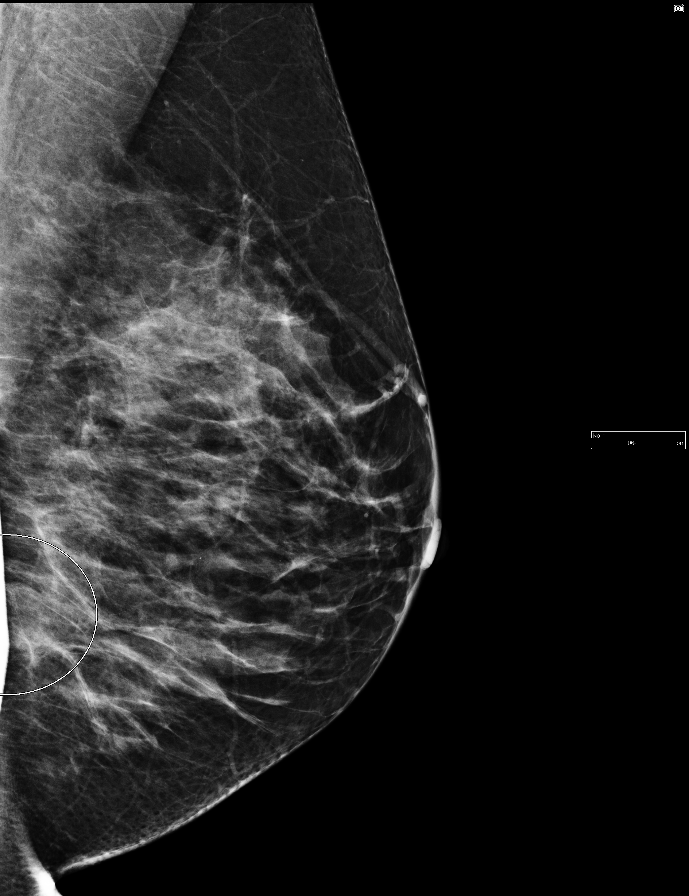

[8 of 24 positions shown; findings below may reference images not displayed]

ACR Breast Density Category c: The breast tissue is heterogeneously
dense, which may obscure small masses.
FINDINGS: Additional tomographic images demonstrate an oval circumscribed
partially visualized mass over the posterior third of the lower left
breast on the lateral image likely over the slightly lateral breast
on the exaggerated CC image.

Mammographic images were processed with CAD.

Targeted ultrasound is performed, showing a cluster of 3 cysts at
the 4 o'clock position of the left breast 6 cm from the nipple
accounting for the mammographic abnormality and measuring
approximately 1.1 x 1.3 x 1.8 cm.
IMPRESSION: Cluster of cysts over the 4 o'clock position of the left breast 6 cm
from the nipple measuring 1.1 x 1.3 x 1.8 cm accounting for the
mammographic abnormality.

RECOMMENDATION:
Recommend continued annual bilateral 3D screening mammographic
followup. Also consider genetic counseling consultation for possible
genetic testing and consider adjunct screening breast MRI due to
significant increased lifetime risk of breast cancer.

I have discussed the findings and recommendations with the patient.
Results were also provided in writing at the conclusion of the
visit. If applicable, a reminder letter will be sent to the patient
regarding the next appointment.

BI-RADS CATEGORY  2: Benign.

## 2019-01-22 ENCOUNTER — Other Ambulatory Visit: Payer: Self-pay

## 2019-01-22 ENCOUNTER — Encounter: Payer: Self-pay | Admitting: Nurse Practitioner

## 2019-01-22 ENCOUNTER — Ambulatory Visit (INDEPENDENT_AMBULATORY_CARE_PROVIDER_SITE_OTHER): Payer: 59 | Admitting: Nurse Practitioner

## 2019-01-22 VITALS — BP 105/68 | HR 73 | Temp 98.3°F | Ht 66.0 in | Wt 193.0 lb

## 2019-01-22 DIAGNOSIS — R7303 Prediabetes: Secondary | ICD-10-CM

## 2019-01-22 DIAGNOSIS — Z0000 Encounter for general adult medical examination without abnormal findings: Secondary | ICD-10-CM | POA: Diagnosis not present

## 2019-01-22 DIAGNOSIS — Z23 Encounter for immunization: Secondary | ICD-10-CM | POA: Diagnosis not present

## 2019-01-22 DIAGNOSIS — E663 Overweight: Secondary | ICD-10-CM | POA: Diagnosis not present

## 2019-01-22 DIAGNOSIS — Z13220 Encounter for screening for lipoid disorders: Secondary | ICD-10-CM

## 2019-01-22 NOTE — Progress Notes (Signed)
BP 105/68   Pulse 73   Temp 98.3 F (36.8 C) (Oral)   Ht 5\' 6"  (1.676 m)   Wt 193 lb (87.5 kg)   SpO2 97%   BMI 31.15 kg/m    Subjective:    Patient ID: Sydney Horne, female    DOB: 1966-02-21, 53 y.o.   MRN: [id number]  HPI: Sydney MCCONAUGHEY is a 53 y.o. female presenting on 01/22/2019 for comprehensive medical examination. Current medical complaints include:none  She currently lives with: husband Menopausal Symptoms: no  Depression Screen done today and results listed below:  Depression screen [facility] 2/9 01/22/2019 01/18/2018 01/12/2017  Decreased Interest 0 0 0  Down, Depressed, Hopeless 0 0 1  PHQ - 2 Score 0 0 1  Altered sleeping 1 0 2  Tired, decreased energy 0 0 0  Change in appetite 1 0 0  Feeling bad or failure about yourself  0 0 0  Trouble concentrating 0 0 1  Moving slowly or fidgety/restless 0 0 0  Suicidal thoughts 0 0 0  PHQ-9 Score 2 0 4  Difficult doing work/chores Not difficult at all - -    The patient does not have a history of falls. I did not complete a risk assessment for falls. A plan of care for falls was not documented.   Past Medical History:  Past Medical History:  Diagnosis Date  . Environmental allergies   . Headache    2x/mo  . Heart murmur    followed by PCP  . Kidney stones   . Neck pain    sees chiropractor for adjustment when having problems  . Vertigo 2017   2 episodes    Surgical History:  Past Surgical History:  Procedure Laterality Date  . bladder control surgery     TVT, performed in 2015  . COLONOSCOPY WITH PROPOFOL N/A 11/27/2016   Procedure: COLONOSCOPY WITH PROPOFOL;  Surgeon: Lucilla Lame, MD;  Location: [place];  Service: Endoscopy;  Laterality: N/A;    Medications:  Current Outpatient Medications on File Prior to Visit  Medication Sig  . calcium citrate-vitamin D (CITRACAL+D) 315-200 MG-UNIT tablet Take 1 tablet by mouth daily.  . Cranberry (THERACRAN ONE PO) Take 1 tablet by mouth daily.  Marland Kitchen estradiol  (ESTRACE) 0.1 MG/GM vaginal cream INSERT 1 APPLICATORFUL  VAGINALLY TWICE WEEKLY  . fluticasone (FLONASE) 50 MCG/ACT nasal spray Place 2 sprays into both nostrils as needed.   Marland Kitchen ibuprofen (ADVIL,MOTRIN) 200 MG tablet Take 200 mg by mouth every 6 (six) hours as needed for headache.  . Multiple Vitamin (MULTIVITAMIN) tablet Take 1 tablet by mouth daily.  Marland Kitchen pyridoxine (B-6) 100 MG tablet Take 200 mg by mouth daily.  Marland Kitchen terbinafine (LAMISIL) 250 MG tablet Take 1 tablet (250 mg total) by mouth daily.   No current facility-administered medications on file prior to visit.     Allergies:  Allergies  Allergen Reactions  . Other Nausea And Vomiting    sauerkraut  . Rye Grass Flower Pollen Extract [Gramineae Pollens] Nausea And Vomiting    Rye - food product  . Zithromax [Azithromycin] Diarrhea       . Adhesive [Tape] Rash    Band-aids    Social History:  Social History   Socioeconomic History  . Marital status: Married    Spouse name: Not on file  . Number of children: Not on file  . Years of education: Not on file  . Highest education level: Not on file  Occupational  History  . Not on file  Social Needs  . Financial resource strain: Not on file  . Food insecurity    Worry: Not on file    Inability: Not on file  . Transportation needs    Medical: Not on file    Non-medical: Not on file  Tobacco Use  . Smoking status: Never Smoker  . Smokeless tobacco: Never Used  Substance and Sexual Activity  . Alcohol use: Yes    Alcohol/week: 0.0 standard drinks    Comment: 1 drink/mo  . Drug use: No  . Sexual activity: Yes    Birth control/protection: None  Lifestyle  . Physical activity    Days per week: 2 days    Minutes per session: 50 min  . Stress: Not on file  Relationships  . Social Herbalist on phone: Not on file    Gets together: Not on file    Attends religious service: Not on file    Active member of club or organization: Not on file    Attends meetings of  clubs or organizations: Not on file    Relationship status: Not on file  . Intimate partner violence    Fear of current or ex partner: Not on file    Emotionally abused: Not on file    Physically abused: Not on file    Forced sexual activity: Not on file  Other Topics Concern  . Not on file  Social History Narrative  . Not on file   Social History   Tobacco Use  Smoking Status Never Smoker  Smokeless Tobacco Never Used   Social History   Substance and Sexual Activity  Alcohol Use Yes  . Alcohol/week: 0.0 standard drinks   Comment: 1 drink/mo    Family History:  Family History  Problem Relation Age of Onset  . Breast cancer Mother 69  . Cancer Mother        breast  . Cancer Father        prostate  . Breast cancer Sister 4  . Cancer Sister        breast  . Diabetes Sister   . Colon cancer Sister   . Breast cancer Maternal Aunt   . Breast cancer Paternal Aunt   . Breast cancer Paternal Grandmother   . Cancer Paternal Grandmother        breast  . Diabetes Maternal Grandmother   . Heart attack Maternal Grandfather   . Diabetes Sister   . Ovarian cancer Neg Hx     Past medical history, surgical history, medications, allergies, family history and social history reviewed with patient today and changes made to appropriate areas of the chart.   Review of Systems - negative All other ROS negative except what is listed above and in the HPI.      Objective:    BP 105/68   Pulse 73   Temp 98.3 F (36.8 C) (Oral)   Ht 5\' 6"  (1.676 m)   Wt 193 lb (87.5 kg)   SpO2 97%   BMI 31.15 kg/m   Wt Readings from Last 3 Encounters:  01/22/19 193 lb (87.5 kg)  11/22/18 192 lb 14.4 oz (87.5 kg)  11/04/18 195 lb (88.5 kg)    Physical Exam Vitals signs and nursing note reviewed.  Constitutional:      General: She is awake. She is not in acute distress.    Appearance: She is well-developed, well-groomed and overweight. She is not ill-appearing.  HENT:     Head:  Normocephalic.     Right Ear: Hearing, tympanic membrane, ear canal and external ear normal. No drainage.     Left Ear: Hearing, tympanic membrane, ear canal and external ear normal. No drainage.     Nose: Nose normal.     Mouth/Throat:     Mouth: Mucous membranes are moist.     Pharynx: Uvula midline.  Eyes:     General: Lids are normal.        Right eye: No discharge.        Left eye: No discharge.     Extraocular Movements: Extraocular movements intact.     Conjunctiva/sclera: Conjunctivae normal.     Pupils: Pupils are equal, round, and reactive to light.     Visual Fields: Right eye visual fields normal and left eye visual fields normal.  Neck:     Musculoskeletal: Normal range of motion and neck supple.     Thyroid: No thyromegaly.     Vascular: No carotid bruit.  Cardiovascular:     Rate and Rhythm: Normal rate and regular rhythm.     Heart sounds: Normal heart sounds. No murmur. No gallop.   Pulmonary:     Effort: Pulmonary effort is normal. No accessory muscle usage or respiratory distress.     Breath sounds: Normal breath sounds.  Abdominal:     General: Bowel sounds are normal.     Palpations: Abdomen is soft. There is no hepatomegaly or splenomegaly.     Tenderness: There is no abdominal tenderness.  Musculoskeletal:     Right lower leg: No edema.     Left lower leg: No edema.  Lymphadenopathy:     Head:     Right side of head: No submental, submandibular, tonsillar, preauricular or posterior auricular adenopathy.     Left side of head: No submental, submandibular, tonsillar, preauricular or posterior auricular adenopathy.     Cervical: No cervical adenopathy.  Skin:    General: Skin is warm and dry.  Neurological:     Mental Status: She is alert and oriented to person, place, and time.     Cranial Nerves: Cranial nerves are intact.     Gait: Gait is intact.     Deep Tendon Reflexes: Reflexes are normal and symmetric.     Reflex Scores:      Brachioradialis  reflexes are 2+ on the right side and 2+ on the left side.      Patellar reflexes are 2+ on the right side and 2+ on the left side. Psychiatric:        Attention and Perception: Attention normal.        Mood and Affect: Mood normal.        Speech: Speech normal.        Behavior: Behavior normal. Behavior is cooperative.        Thought Content: Thought content normal.        Judgment: Judgment normal.     Results for orders placed or performed in visit on 11/22/18  Cytology - PAP  Result Value Ref Range   Adequacy      Satisfactory for evaluation  endocervical/transformation zone component ABSENT.   Diagnosis      NEGATIVE FOR INTRAEPITHELIAL LESIONS OR MALIGNANCY.   HPV NOT DETECTED    Material Submitted CervicoVaginal Pap [ThinPrep Imaged]       Assessment & Plan:   Problem List Items Addressed This Visit      Other  Overweight    Recommend continued focus on health diet choices and regular physical activity (30 minutes 5 days a week).      Prediabetes    Recheck A1C.      Relevant Orders   HgB A1c    Other Visit Diagnoses    Encounter for annual physical exam    -  Primary   Relevant Orders   CBC with Differential/Platelet   Comprehensive metabolic panel   Lipid Panel w/o Chol/HDL Ratio   TSH   Flu vaccine need       Relevant Orders   Flu Vaccine QUAD 36+ mos IM   Screening cholesterol level       Relevant Orders   Lipid Panel w/o Chol/HDL Ratio       Follow up plan: Return in about 1 year (around 01/22/2020) for Annual physical.   LABORATORY TESTING:  - Pap smear: up to date  IMMUNIZATIONS:   - Tdap: Tetanus vaccination status reviewed: last tetanus booster within 10 years. - Influenza: Administered today - Pneumovax: Not applicable - Prevnar: Not applicable - HPV: Not applicable - Zostavax vaccine: Refused  SCREENING: -Mammogram: Up to date  - Colonoscopy: Up to date  - Bone Density: Not applicable  -Hearing Test: Not applicable   -Spirometry: Not applicable   PATIENT COUNSELING:   Advised to take 1 mg of folate supplement per day if capable of pregnancy.   Sexuality: Discussed sexually transmitted diseases, partner selection, use of condoms, avoidance of unintended pregnancy  and contraceptive alternatives.   Advised to avoid cigarette smoking.  I discussed with the patient that most people either abstain from alcohol or drink within safe limits (<=14/week and <=4 drinks/occasion for males, <=7/weeks and <= 3 drinks/occasion for females) and that the risk for alcohol disorders and other health effects rises proportionally with the number of drinks per week and how often a drinker exceeds daily limits.  Discussed cessation/primary prevention of drug use and availability of treatment for abuse.   Diet: Encouraged to adjust caloric intake to maintain  or achieve ideal body weight, to reduce intake of dietary saturated fat and total fat, to limit sodium intake by avoiding high sodium foods and not adding table salt, and to maintain adequate dietary potassium and calcium preferably from fresh fruits, vegetables, and low-fat dairy products.    stressed the importance of regular exercise  Injury prevention: Discussed safety belts, safety helmets, smoke detector, smoking near bedding or upholstery.   Dental health: Discussed importance of regular tooth brushing, flossing, and dental visits.    NEXT PREVENTATIVE PHYSICAL DUE IN 1 YEAR. Return in about 1 year (around 01/22/2020) for Annual physical.

## 2019-01-22 NOTE — Assessment & Plan Note (Signed)
Recheck A1C 

## 2019-01-22 NOTE — Assessment & Plan Note (Signed)
Recommend continued focus on health diet choices and regular physical activity (30 minutes 5 days a week). 

## 2019-01-22 NOTE — Patient Instructions (Signed)
Healthy Eating Following a healthy eating pattern may help you to achieve and maintain a healthy body weight, reduce the risk of chronic disease, and live a long and productive life. It is important to follow a healthy eating pattern at an appropriate calorie level for your body. Your nutritional needs should be met primarily through food by choosing a variety of nutrient-rich foods. What are tips for following this plan? Reading food labels  Read labels and choose the following: ? Reduced or low sodium. ? Juices with 100% fruit juice. ? Foods with low saturated fats and high polyunsaturated and monounsaturated fats. ? Foods with whole grains, such as whole wheat, cracked wheat, brown rice, and wild rice. ? Whole grains that are fortified with folic acid. This is recommended for women who are pregnant or who want to become pregnant.  Read labels and avoid the following: ? Foods with a lot of added sugars. These include foods that contain brown sugar, corn sweetener, corn syrup, dextrose, fructose, glucose, high-fructose corn syrup, honey, invert sugar, lactose, malt syrup, maltose, molasses, raw sugar, sucrose, trehalose, or turbinado sugar.  Do not eat more than the following amounts of added sugar per day:  6 teaspoons (25 g) for women.  9 teaspoons (38 g) for men. ? Foods that contain processed or refined starches and grains. ? Refined grain products, such as white flour, degermed cornmeal, white bread, and white rice. Shopping  Choose nutrient-rich snacks, such as vegetables, whole fruits, and nuts. Avoid high-calorie and high-sugar snacks, such as potato chips, fruit snacks, and candy.  Use oil-based dressings and spreads on foods instead of solid fats such as butter, stick margarine, or cream cheese.  Limit pre-made sauces, mixes, and "instant" products such as flavored rice, instant noodles, and ready-made pasta.  Try more plant-protein sources, such as tofu, tempeh, black beans,  edamame, lentils, nuts, and seeds.  Explore eating plans such as the Mediterranean diet or vegetarian diet. Cooking  Use oil to saut or stir-fry foods instead of solid fats such as butter, stick margarine, or lard.  Try baking, boiling, grilling, or broiling instead of frying.  Remove the fatty part of meats before cooking.  Steam vegetables in water or broth. Meal planning   At meals, imagine dividing your plate into fourths: ? One-half of your plate is fruits and vegetables. ? One-fourth of your plate is whole grains. ? One-fourth of your plate is protein, especially lean meats, poultry, eggs, tofu, beans, or nuts.  Include low-fat dairy as part of your daily diet. Lifestyle  Choose healthy options in all settings, including home, work, school, restaurants, or stores.  Prepare your food safely: ? Wash your hands after handling raw meats. ? Keep food preparation surfaces clean by regularly washing with hot, soapy water. ? Keep raw meats separate from ready-to-eat foods, such as fruits and vegetables. ? Cook seafood, meat, poultry, and eggs to the recommended internal temperature. ? Store foods at safe temperatures. In general:  Keep cold foods at 59F (4.4C) or below.  Keep hot foods at 159F (60C) or above.  Keep your freezer at [facility] (-17.8C) or below.  Foods are no longer safe to eat when they have been between the temperatures of 40-159F (4.4-60C) for more than 2 hours. What foods should I eat? Fruits Aim to eat 2 cup-equivalents of fresh, canned (in natural juice), or frozen fruits each day. Examples of 1 cup-equivalent of fruit include 1 small apple, 8 large strawberries, 1 cup canned fruit,  cup  dried fruit, or 1 cup 100% juice. Vegetables Aim to eat 2-3 cup-equivalents of fresh and frozen vegetables each day, including different varieties and colors. Examples of 1 cup-equivalent of vegetables include 2 medium carrots, 2 cups raw, leafy greens, 1 cup chopped  vegetable (raw or cooked), or 1 medium baked potato. Grains Aim to eat 6 ounce-equivalents of whole grains each day. Examples of 1 ounce-equivalent of grains include 1 slice of bread, 1 cup ready-to-eat cereal, 3 cups popcorn, or  cup cooked rice, pasta, or cereal. Meats and other proteins Aim to eat 5-6 ounce-equivalents of protein each day. Examples of 1 ounce-equivalent of protein include 1 egg, 1/2 cup nuts or seeds, or 1 tablespoon (16 g) peanut butter. A cut of meat or fish that is the size of a deck of cards is about 3-4 ounce-equivalents.  Of the protein you eat each week, try to have at least 8 ounces come from seafood. This includes salmon, trout, herring, and anchovies. Dairy Aim to eat 3 cup-equivalents of fat-free or low-fat dairy each day. Examples of 1 cup-equivalent of dairy include 1 cup (240 mL) milk, 8 ounces (250 g) yogurt, 1 ounces (44 g) natural cheese, or 1 cup (240 mL) fortified soy milk. Fats and oils  Aim for about 5 teaspoons (21 g) per day. Choose monounsaturated fats, such as canola and olive oils, avocados, peanut butter, and most nuts, or polyunsaturated fats, such as sunflower, corn, and soybean oils, walnuts, pine nuts, sesame seeds, sunflower seeds, and flaxseed. Beverages  Aim for six 8-oz glasses of water per day. Limit coffee to three to five 8-oz cups per day.  Limit caffeinated beverages that have added calories, such as soda and energy drinks.  Limit alcohol intake to no more than 1 drink a day for nonpregnant women and 2 drinks a day for men. One drink equals 12 oz of beer (355 mL), 5 oz of wine (148 mL), or 1 oz of hard liquor (44 mL). Seasoning and other foods  Avoid adding excess amounts of salt to your foods. Try flavoring foods with herbs and spices instead of salt.  Avoid adding sugar to foods.  Try using oil-based dressings, sauces, and spreads instead of solid fats. This information is based on general [place] nutrition guidelines. For more  information, visit choosemyplate.gov. Exact amounts may vary based on your nutrition needs. Summary  A healthy eating plan may help you to maintain a healthy weight, reduce the risk of chronic diseases, and stay active throughout your life.  Plan your meals. Make sure you eat the right portions of a variety of nutrient-rich foods.  Try baking, boiling, grilling, or broiling instead of frying.  Choose healthy options in all settings, including home, work, school, restaurants, or stores. This information is not intended to replace advice given to you by your health care provider. Make sure you discuss any questions you have with your health care provider. Document Released: 08/13/2017 Document Revised: 08/13/2017 Document Reviewed: 08/13/2017 Elsevier Patient Education  2020 Elsevier Inc.  

## 2019-01-23 LAB — COMPREHENSIVE METABOLIC PANEL
ALT: 19 IU/L (ref 0–32)
AST: 19 IU/L (ref 0–40)
Albumin/Globulin Ratio: 2.4 — ABNORMAL HIGH (ref 1.2–2.2)
Albumin: 4.5 g/dL (ref 3.8–4.9)
Alkaline Phosphatase: 81 IU/L (ref 39–117)
BUN/Creatinine Ratio: 15 (ref 9–23)
BUN: 14 mg/dL (ref 6–24)
Bilirubin Total: 0.3 mg/dL (ref 0.0–1.2)
CO2: 26 mmol/L (ref 20–29)
Calcium: 10 mg/dL (ref 8.7–10.2)
Chloride: 101 mmol/L (ref 96–106)
Creatinine, Ser: 0.94 mg/dL (ref 0.57–1.00)
GFR calc Af Amer: 80 mL/min/{1.73_m2} (ref >59)
GFR calc non Af Amer: 69 mL/min/{1.73_m2} (ref >59)
Globulin, Total: 1.9 g/dL (ref 1.5–4.5)
Glucose: 98 mg/dL (ref 65–99)
Potassium: 4.8 mmol/L (ref 3.5–5.2)
Sodium: 140 mmol/L (ref 134–144)
Total Protein: 6.4 g/dL (ref 6.0–8.5)

## 2019-01-23 LAB — HEMOGLOBIN A1C
Est. average glucose Bld gHb Est-mCnc: 120 mg/dL
Hgb A1c MFr Bld: 5.8 % — ABNORMAL HIGH (ref 4.8–5.6)

## 2019-01-23 LAB — CBC WITH DIFFERENTIAL/PLATELET
Basophils Absolute: 0 10*3/uL (ref 0.0–0.2)
Basos: 1 %
EOS (ABSOLUTE): 0.1 10*3/uL (ref 0.0–0.4)
Eos: 1 %
Hematocrit: 42.7 % (ref 34.0–46.6)
Hemoglobin: 14 g/dL (ref 11.1–15.9)
Immature Grans (Abs): 0 10*3/uL (ref 0.0–0.1)
Immature Granulocytes: 0 %
Lymphocytes Absolute: 1.6 10*3/uL (ref 0.7–3.1)
Lymphs: 20 %
MCH: 30.3 pg (ref 26.6–33.0)
MCHC: 32.8 g/dL (ref 31.5–35.7)
MCV: 92 fL (ref 79–97)
Monocytes Absolute: 0.7 10*3/uL (ref 0.1–0.9)
Monocytes: 9 %
Neutrophils Absolute: 5.4 10*3/uL (ref 1.4–7.0)
Neutrophils: 69 %
Platelets: 190 10*3/uL (ref 150–450)
RBC: 4.62 x10E6/uL (ref 3.77–5.28)
RDW: 13.7 % (ref 11.7–15.4)
WBC: 7.8 10*3/uL (ref 3.4–10.8)

## 2019-01-23 LAB — LIPID PANEL W/O CHOL/HDL RATIO
Cholesterol, Total: 136 mg/dL (ref 100–199)
HDL: 47 mg/dL (ref >39)
LDL Chol Calc (NIH): 60 mg/dL (ref 0–99)
Triglycerides: 170 mg/dL — ABNORMAL HIGH (ref 0–149)
VLDL Cholesterol Cal: 29 mg/dL (ref 5–40)

## 2019-01-23 LAB — TSH: TSH: 2.23 u[IU]/mL (ref 0.450–4.500)

## 2019-08-09 ENCOUNTER — Ambulatory Visit: Payer: 59 | Attending: Internal Medicine

## 2019-08-09 DIAGNOSIS — Z23 Encounter for immunization: Secondary | ICD-10-CM

## 2019-08-09 NOTE — Progress Notes (Signed)
   Covid-19 Vaccination Clinic  Name:  Sydney Horne    MRN: [id number] DOB: 1965-07-24  08/09/2019  Ms. Darrin was observed post Covid-19 immunization for 15 minutes without incident. She was provided with Vaccine Information Sheet and instruction to access the V-Safe system.   Ms. Muenzer was instructed to call 911 with any severe reactions post vaccine: Marland Kitchen Difficulty breathing  . Swelling of face and throat  . A fast heartbeat  . A bad rash all over body  . Dizziness and weakness   Immunizations Administered    Name Date Dose VIS Date Route   Pfizer COVID-19 Vaccine 08/09/2019  8:28 AM 0.3 mL 04/25/2019 Intramuscular   Manufacturer: [place]   Lot: [id number]   [place]: [id number]

## 2019-08-30 ENCOUNTER — Ambulatory Visit: Payer: 59 | Attending: Internal Medicine

## 2019-08-30 DIAGNOSIS — Z23 Encounter for immunization: Secondary | ICD-10-CM

## 2019-08-30 NOTE — Progress Notes (Signed)
   Covid-19 Vaccination Clinic  Name:  Sydney Horne    MRN: [id number] DOB: Dec 26, 1965  08/30/2019  [name] was observed post Covid-19 immunization for 15 minutes without incident. She was provided with Vaccine Information Sheet and instruction to access the V-Safe system.   [name] was instructed to call 911 with any severe reactions post vaccine: Marland Kitchen Difficulty breathing  . Swelling of face and throat  . A fast heartbeat  . A bad rash all over body  . Dizziness and weakness   Immunizations Administered    Name Date Dose VIS Date Route   Pfizer COVID-19 Vaccine 08/30/2019  8:13 AM 0.3 mL 04/25/2019 Intramuscular   Manufacturer: [place]   Lot: [id number]   [place]: [id number]

## 2019-11-24 NOTE — Progress Notes (Signed)
GYNECOLOGY ANNUAL PHYSICAL EXAM PROGRESS NOTE  Subjective:    Sydney Horne is a 54 y.o. G17P2002 female who presents for an annual exam. The patient wears seatbelts: yes. The patient participates in regular exercise: yes (walking 2 miles 4 days per week, attends gym ~ 2 days per week). Overall doing well. She denies complaints today.    The patient has the following concerns today:  1. Inquires what to expect with menopause.     Menstrual History: OB History    Gravida  2   Para  2   Term  2   Preterm      AB      Living  2     SAB      TAB      Ectopic      Multiple      Live Births  2           Menarche age: 63 Patient's last menstrual period was 11/11/2019 (exact date). Period Duration (Days): 3 Period Pattern: (!) Irregular Menstrual Flow: Moderate Menstrual Control: Maxi pad Menstrual Control Change Freq (Hours): 4 Dysmenorrhea: None   Gynecologic History  Contraception: none History of STI's: Denies Last Pap: 11/22/2018. Results were: normal.  Denies h/o abnormal pap smears. Last mammogram: 01/03/2019. Results were: normal Last colonoscopy: 11/2016. Normal. To repeat in 10 years.   OB History  Gravida Para Term Preterm AB Living  _0 0 0 2  SAB TAB Ectopic Multiple Live Births  0 0 0 0 2    # Outcome Date GA Lbr Len/2nd Weight Sex Delivery Anes PTL Lv  2 Term 1999   8 lb 12.8 oz (3.992 kg) M Vag-Spont   LIV  1 Term 1997   8 lb 1.6 oz (3.674 kg) F Vag-Spont   LIV    Past Medical History:  Diagnosis Date  . Environmental allergies   . Headache    2x/mo  . Heart murmur    followed by PCP  . Kidney stones   . Neck pain    sees chiropractor for adjustment when having problems  . Vertigo 2017   2 episodes    Past Surgical History:  Procedure Laterality Date  . bladder control surgery     TVT, performed in 2015  . COLONOSCOPY WITH PROPOFOL N/A 11/27/2016   Procedure: COLONOSCOPY WITH PROPOFOL;  Surgeon: Lucilla Lame, MD;   Location: [facility];  Service: Endoscopy;  Laterality: N/A;    Family History  Problem Relation Age of Onset  . Breast cancer Mother 72  . Cancer Mother        breast  . Cancer Father        prostate  . Breast cancer Sister 51  . Cancer Sister        breast  . Diabetes Sister   . Colon cancer Sister   . Breast cancer Maternal Aunt   . Breast cancer Paternal Aunt   . Breast cancer Paternal Grandmother   . Cancer Paternal Grandmother        breast  . Diabetes Maternal Grandmother   . Heart attack Maternal Grandfather   . Diabetes Sister   . Ovarian cancer Neg Hx     Social History   Socioeconomic History  . Marital status: Married    Spouse name: Not on file  . Number of children: Not on file  . Years of education: Not on file  . Highest education level: Not on file  Occupational History  . Not on file  Tobacco Use  . Smoking status: Never Smoker  . Smokeless tobacco: Never Used  Vaping Use  . Vaping Use: Never used  Substance and Sexual Activity  . Alcohol use: Yes    Alcohol/week: 0.0 standard drinks    Comment: 1 drink/mo  . Drug use: No  . Sexual activity: Yes    Birth control/protection: None  Other Topics Concern  . Not on file  Social History Narrative  . Not on file   Social Determinants of Health   Financial Resource Strain:   . Difficulty of Paying Living Expenses:   Food Insecurity:   . Worried About Charity fundraiser in the Last Year:   . Arboriculturist in the Last Year:   Transportation Needs:   . Film/video editor (Medical):   Marland Kitchen Lack of Transportation (Non-Medical):   Physical Activity:   . Days of Exercise per Week:   . Minutes of Exercise per Session:   Stress:   . Feeling of Stress :   Social Connections:   . Frequency of Communication with Friends and Family:   . Frequency of Social Gatherings with Friends and Family:   . Attends Religious Services:   . Active Member of Clubs or Organizations:   . Attends English as a second language teacher Meetings:   Marland Kitchen Marital Status:   Intimate Partner Violence:   . Fear of Current or Ex-Partner:   . Emotionally Abused:   Marland Kitchen Physically Abused:   . Sexually Abused:     Current Outpatient Medications on File Prior to Visit  Medication Sig Dispense Refill  . calcium citrate-vitamin D (CITRACAL+D) 315-200 MG-UNIT tablet Take 1 tablet by mouth daily.    . Cranberry (THERACRAN ONE PO) Take 1 tablet by mouth daily.    Marland Kitchen estradiol (ESTRACE) 0.1 MG/GM vaginal cream INSERT 1 APPLICATORFUL  VAGINALLY TWICE WEEKLY 127.5 g 3  . fluticasone (FLONASE) 50 MCG/ACT nasal spray Place 2 sprays into both nostrils as needed.     Marland Kitchen ibuprofen (ADVIL,MOTRIN) 200 MG tablet Take 200 mg by mouth every 6 (six) hours as needed for headache.    . Multiple Vitamin (MULTIVITAMIN) tablet Take 1 tablet by mouth daily.    Marland Kitchen pyridoxine (B-6) 100 MG tablet Take 200 mg by mouth daily.    Marland Kitchen terbinafine (LAMISIL) 250 MG tablet Take 1 tablet (250 mg total) by mouth daily. 90 tablet 0   No current facility-administered medications on file prior to visit.    Allergies  Allergen Reactions  . Other Nausea And Vomiting    sauerkraut  . Rye Grass Flower Pollen Extract [Gramineae Pollens] Nausea And Vomiting    Rye - food product  . Zithromax [Azithromycin] Diarrhea       . Adhesive [Tape] Rash    Band-aids      Review of Systems Constitutional: negative for chills, fatigue, fevers and sweats  Eyes: negative for irritation, redness and visual disturbance Ears, nose, mouth, throat, and face: negative for hearing loss, nasal congestion, snoring and tinnitus Respiratory: negative for asthma, cough, sputum Cardiovascular: negative for chest pain, dyspnea, exertional chest pressure/discomfort, irregular heart beat, palpitations and syncope Gastrointestinal: negative for abdominal pain, change in bowel habits, nausea and vomiting Genitourinary: negative for abnormal menstrual periods, genital lesions, sexual  problems and vaginal discharge, dysuria and urinary incontinence Integument/breast: negative for breast lump, breast tenderness and nipple discharge Hematologic/lymphatic: negative for bleeding and easy bruising Musculoskeletal:negative for back pain and muscle  weakness Neurological: negative for dizziness, headaches, vertigo and weakness Endocrine: negative for diabetic symptoms including polydipsia, polyuria and skin dryness Allergic/Immunologic: negative for hay fever and urticaria       Objective:  Blood pressure 117/71, pulse 68, height _0  (1.727 m), weight 194 lb 8 oz (88.2 kg), last menstrual period 11/11/2019. Body mass index is 29.57 kg/m.  General Appearance:    Alert, cooperative, no distress, appears stated age, overweight  Head:    Normocephalic, without obvious abnormality, atraumatic  Eyes:    PERRL, conjunctiva/corneas clear, EOM's intact, both eyes  Ears:    Normal external ear canals, both ears  Nose:   Nares normal, septum midline, mucosa normal, no drainage or sinus tenderness  Throat:   Lips, mucosa, and tongue normal; teeth and gums normal  Neck:   Supple, symmetrical, trachea midline, no adenopathy; thyroid: no enlargement/tenderness/nodules; no carotid bruit or JVD  Back:     Symmetric, no curvature, ROM normal, no CVA tenderness  Lungs:     Clear to auscultation bilaterally, respirations unlabored  Chest Wall:    No tenderness or deformity   Heart:    Regular rate and rhythm, S1 and S2 normal, no murmur, rub or gallop  Breast Exam:    No tenderness, masses, or nipple abnormality  Abdomen:     Soft, non-tender, bowel sounds active all four quadrants, no masses, no organomegaly.    Genitalia:    Pelvic:external genitalia normal, vagina without lesions, discharge, or tenderness. Good estrogen effect noted.  Rectovaginal septum  normal. Cervix normal in appearance, no cervical motion tenderness, no adnexal masses or tenderness.  Uterus normal size, shape, mobile,  regular contours, nontender.  Rectal:    Normal external sphincter.  No hemorrhoids appreciated. Internal exam not done.   Extremities:   Extremities normal, atraumatic, no cyanosis or edema  Pulses:   2+ and symmetric all extremities  Skin:   Skin color, texture, turgor normal, no rashes or lesions  Lymph nodes:   Cervical, supraclavicular, and axillary nodes normal  Neurologic:   CNII-XII intact, normal strength, sensation and reflexes throughout   .  Labs:  Lab Results  Component Value Date   WBC 7.8 01/22/2019   HGB 14.0 01/22/2019   HCT 42.7 01/22/2019   MCV 92 01/22/2019   PLT 190 01/22/2019    Lab Results  Component Value Date   CREATININE 0.94 01/22/2019   BUN 14 01/22/2019   NA 140 01/22/2019   K 4.8 01/22/2019   CL 101 01/22/2019   CO2 26 01/22/2019    Lab Results  Component Value Date   ALT 19 01/22/2019   AST 19 01/22/2019   ALKPHOS 81 01/22/2019   BILITOT 0.3 01/22/2019    Lab Results  Component Value Date   TSH 2.230 01/22/2019     Assessment:    Healthy female exam.   Family history of breast cancer  Family history of colon cancer Overweight Genitourinary syndrome Perimenopausal  Plan:     Blood tests: Labs to be performed by PCP in 1-2 months.  Breast self exam technique reviewed and patient encouraged to perform self-exam monthly. Contraception: none. Contraception declined as patient is close to menopause.  Discussed healthy lifestyle modifications. Mammogram ordered.  Sister-negative BRCA 1/BRCA2.  Colon cancer screening up to date.  Calcium with vitamin D supplementation 1200 mg daily Pap smear up to date. Genitourinary syndrome (vaginal, urethral atrophy) managed with Estrace cream. Refill given.  Has completed COVID vaccination series.  Given anticipatory  guidance on menopause.  Follow up in 1 year with annual exam.    The pregnancy intention screening data noted above was reviewed. Potential methods of contraception were  discussed. The patient elected to proceed with No Method - No Contraceptive Precautions.    Rubie Maid, MD [facility]

## 2019-11-25 ENCOUNTER — Encounter: Payer: Self-pay | Admitting: Obstetrics and Gynecology

## 2019-11-25 ENCOUNTER — Other Ambulatory Visit: Payer: Self-pay

## 2019-11-25 ENCOUNTER — Ambulatory Visit (INDEPENDENT_AMBULATORY_CARE_PROVIDER_SITE_OTHER): Payer: 59 | Admitting: Obstetrics and Gynecology

## 2019-11-25 VITALS — BP 117/71 | HR 68 | Ht 68.0 in | Wt 194.5 lb

## 2019-11-25 DIAGNOSIS — Z1231 Encounter for screening mammogram for malignant neoplasm of breast: Secondary | ICD-10-CM

## 2019-11-25 DIAGNOSIS — Z01419 Encounter for gynecological examination (general) (routine) without abnormal findings: Secondary | ICD-10-CM

## 2019-11-25 DIAGNOSIS — Z803 Family history of malignant neoplasm of breast: Secondary | ICD-10-CM

## 2019-11-25 DIAGNOSIS — E663 Overweight: Secondary | ICD-10-CM | POA: Diagnosis not present

## 2019-11-25 DIAGNOSIS — N952 Postmenopausal atrophic vaginitis: Secondary | ICD-10-CM | POA: Diagnosis not present

## 2019-11-25 DIAGNOSIS — Z800 Family history of malignant neoplasm of digestive organs: Secondary | ICD-10-CM

## 2019-11-25 MED ORDER — ESTRADIOL 0.1 MG/GM VA CREA: TOPICAL_CREAM | VAGINAL | 3 refills | Status: DC

## 2019-11-25 NOTE — Patient Instructions (Addendum)
Preventive Care 40-55 Years Old, Female Preventive care refers to visits with your health care provider and lifestyle choices that can promote health and wellness. This includes:  A yearly physical exam. This may also be called an annual well check.  Regular dental visits and eye exams.  Immunizations.  Screening for certain conditions.  Healthy lifestyle choices, such as eating a healthy diet, getting regular exercise, not using drugs or products that contain nicotine and tobacco, and limiting alcohol use. What can I expect for my preventive care visit? Physical exam Your health care provider will check your:  Height and weight. This may be used to calculate body mass index (BMI), which tells if you are at a healthy weight.  Heart rate and blood pressure.  Skin for abnormal spots. Counseling Your health care provider may ask you questions about your:  Alcohol, tobacco, and drug use.  Emotional well-being.  Home and relationship well-being.  Sexual activity.  Eating habits.  Work and work environment.  Method of birth control.  Menstrual cycle.  Pregnancy history. What immunizations do I need?  Influenza (flu) vaccine  This is recommended every year. Tetanus, diphtheria, and pertussis (Tdap) vaccine  You may need a Td booster every 10 years. Varicella (chickenpox) vaccine  You may need this if you have not been vaccinated. Zoster (shingles) vaccine  You may need this after age 60. Measles, mumps, and rubella (MMR) vaccine  You may need at least one dose of MMR if you were born in 1957 or later. You may also need a second dose. Pneumococcal conjugate (PCV13) vaccine  You may need this if you have certain conditions and were not previously vaccinated. Pneumococcal polysaccharide (PPSV23) vaccine  You may need one or two doses if you smoke cigarettes or if you have certain conditions. Meningococcal conjugate (MenACWY) vaccine  You may need this if you  have certain conditions. Hepatitis A vaccine  You may need this if you have certain conditions or if you travel or work in places where you may be exposed to hepatitis A. Hepatitis B vaccine  You may need this if you have certain conditions or if you travel or work in places where you may be exposed to hepatitis B. Haemophilus influenzae type b (Hib) vaccine  You may need this if you have certain conditions. Human papillomavirus (HPV) vaccine  If recommended by your health care provider, you may need three doses over 6 months. You may receive vaccines as individual doses or as more than one vaccine together in one shot (combination vaccines). Talk with your health care provider about the risks and benefits of combination vaccines. What tests do I need? Blood tests  Lipid and cholesterol levels. These may be checked every 5 years, or more frequently if you are over 50 years old.  Hepatitis C test.  Hepatitis B test. Screening  Lung cancer screening. You may have this screening every year starting at age 55 if you have a 30-pack-year history of smoking and currently smoke or have quit within the past 15 years.  Colorectal cancer screening. All adults should have this screening starting at age 50 and continuing until age 75. Your health care provider may recommend screening at age 45 if you are at increased risk. You will have tests every 1-10 years, depending on your results and the type of screening test.  Diabetes screening. This is done by checking your blood sugar (glucose) after you have not eaten for a while (fasting). You may have this   done every 1-3 years.  Mammogram. This may be done every 1-2 years. Talk with your health care provider about when you should start having regular mammograms. This may depend on whether you have a family history of breast cancer.  BRCA-related cancer screening. This may be done if you have a family history of breast, ovarian, tubal, or peritoneal  cancers.  Pelvic exam and Pap test. This may be done every 3 years starting at age 70. Starting at age 87, this may be done every 5 years if you have a Pap test in combination with an HPV test. Other tests  Sexually transmitted disease (STD) testing.  Bone density scan. This is done to screen for osteoporosis. You may have this scan if you are at high risk for osteoporosis. Follow these instructions at home: Eating and drinking  Eat a diet that includes fresh fruits and vegetables, whole grains, lean protein, and low-fat dairy.  Take vitamin and mineral supplements as recommended by your health care provider.  Do not drink alcohol if: ? Your health care provider tells you not to drink. ? You are pregnant, may be pregnant, or are planning to become pregnant.  If you drink alcohol: ? Limit how much you have to 0-1 drink a day. ? Be aware of how much alcohol is in your drink. In the [place] one drink equals one 12 oz bottle of beer (355 mL), one 5 oz glass of wine (148 mL), or one 1 oz glass of hard liquor (44 mL). Lifestyle  Take daily care of your teeth and gums.  Stay active. Exercise for at least 30 minutes on 5 or more days each week.  Do not use any products that contain nicotine or tobacco, such as cigarettes, e-cigarettes, and chewing tobacco. If you need help quitting, ask your health care provider.  If you are sexually active, practice safe sex. Use a condom or other form of birth control (contraception) in order to prevent pregnancy and STIs (sexually transmitted infections).  If told by your health care provider, take low-dose aspirin daily starting at age 59. What's next?  Visit your health care provider once a year for a well check visit.  Ask your health care provider how often you should have your eyes and teeth checked.  Stay up to date on all vaccines. This information is not intended to replace advice given to you by your health care provider. Make sure you  discuss any questions you have with your health care provider. Document Revised: 01/10/2018 Document Reviewed: 01/10/2018 Elsevier Patient Education  2020 [employer] Breast self-awareness is knowing how your breasts look and feel. Doing breast self-awareness is important. It allows you to catch a breast problem early while it is still small and can be treated. All women should do breast self-awareness, including women who have had breast implants. Tell your doctor if you notice a change in your breasts. What you need:  A mirror.  A well-lit room. How to do a breast self-exam A breast self-exam is one way to learn what is normal for your breasts and to check for changes. To do a breast self-exam: Look for changes  1. Take off all the clothes above your waist. 2. Stand in front of a mirror in a room with good lighting. 3. Put your hands on your hips. 4. Push your hands down. 5. Look at your breasts and nipples in the mirror to see if one breast or nipple looks different  from the other. Check to see if: ? The shape of one breast is different. ? The size of one breast is different. ? There are wrinkles, dips, and bumps in one breast and not the other. 6. Look at each breast for changes in the skin, such as: ? Redness. ? Scaly areas. 7. Look for changes in your nipples, such as: ? Liquid around the nipples. ? Bleeding. ? Dimpling. ? Redness. ? A change in where the nipples are. Feel for changes  1. Lie on your back on the floor. 2. Feel each breast. To do this, follow these steps: ? Pick a breast to feel. ? Put the arm closest to that breast above your head. ? Use your other arm to feel the nipple area of your breast. Feel the area with the pads of your three middle fingers by making small circles with your fingers. For the first circle, press lightly. For the second circle, press harder. For the third circle, press even harder. ? Keep making circles  with your fingers at the different pressures as you move down your breast. Stop when you feel your ribs. ? Move your fingers a little toward the center of your body. ? Start making circles with your fingers again, this time going up until you reach your collarbone. ? Keep making up-and-down circles until you reach your armpit. Remember to keep using the three pressures. ? Feel the other breast in the same way. 3. Sit or stand in the tub or shower. 4. With soapy water on your skin, feel each breast the same way you did in step 2 when you were lying on the floor. Write down what you find Writing down what you find can help you remember what to tell your doctor. Write down:  What is normal for each breast.  Any changes you find in each breast, including: ? The kind of changes you find. ? Whether you have pain. ? Size and location of any lumps.  When you last had your menstrual period. General tips  Check your breasts every month.  If you are breastfeeding, the best time to check your breasts is after you feed your baby or after you use a breast pump.  If you get menstrual periods, the best time to check your breasts is 5-7 days after your menstrual period is over.  With time, you will become comfortable with the self-exam, and you will begin to know if there are changes in your breasts. Contact a doctor if you:  See a change in the shape or size of your breasts or nipples.  See a change in the skin of your breast or nipples, such as red or scaly skin.  Have fluid coming from your nipples that is not normal.  Find a lump or thick area that was not there before.  Have pain in your breasts.  Have any concerns about your breast health. Summary  Breast self-awareness includes looking for changes in your breasts, as well as feeling for changes within your breasts.  Breast self-awareness should be done in front of a mirror in a well-lit room.  You should check your breasts every  month. If you get menstrual periods, the best time to check your breasts is 5-7 days after your menstrual period is over.  Let your doctor know of any changes you see in your breasts, including changes in size, changes on the skin, pain or tenderness, or fluid from your nipples that is not normal. This information  is not intended to replace advice given to you by your health care provider. Make sure you discuss any questions you have with your health care provider. Document Revised: 12/18/2017 Document Reviewed: 12/18/2017 Elsevier Patient Education  2020 [place].    Menopause Menopause is the normal time of life when menstrual periods stop completely. It is usually confirmed by 12 months without a menstrual period. The transition to menopause (perimenopause) most often happens between the ages of 25 and 3. During perimenopause, hormone levels change in your body, which can cause symptoms and affect your health. Menopause may increase your risk for:  Loss of bone (osteoporosis), which causes bone breaks (fractures).  Depression.  Hardening and narrowing of the arteries (atherosclerosis), which can cause heart attacks and strokes. What are the causes? This condition is usually caused by a natural change in hormone levels that happens as you get older. The condition may also be caused by surgery to remove both ovaries (bilateral oophorectomy). What increases the risk? This condition is more likely to start at an earlier age if you have certain medical conditions or treatments, including:  A tumor of the pituitary gland in the brain.  A disease that affects the ovaries and hormone production.  Radiation treatment for cancer.  Certain cancer treatments, such as chemotherapy or hormone (anti-estrogen) therapy.  Heavy smoking and excessive alcohol use.  Family history of early menopause. This condition is also more likely to develop earlier in women who are very thin. What are the  signs or symptoms? Symptoms of this condition include:  Hot flashes.  Irregular menstrual periods.  Night sweats.  Changes in feelings about sex. This could be a decrease in sex drive or an increased comfort around your sexuality.  Vaginal dryness and thinning of the vaginal walls. This may cause painful intercourse.  Dryness of the skin and development of wrinkles.  Headaches.  Problems sleeping (insomnia).  Mood swings or irritability.  Memory problems.  Weight gain.  Hair growth on the face and chest.  Bladder infections or problems with urinating. How is this diagnosed? This condition is diagnosed based on your medical history, a physical exam, your age, your menstrual history, and your symptoms. Hormone tests may also be done. How is this treated? In some cases, no treatment is needed. You and your health care provider should make a decision together about whether treatment is necessary. Treatment will be based on your individual condition and preferences. Treatment for this condition focuses on managing symptoms. Treatment may include:  Menopausal hormone therapy (MHT).  Medicines to treat specific symptoms or complications.  Acupuncture.  Vitamin or herbal supplements. Before starting treatment, make sure to let your health care provider know if you have a personal or family history of:  Heart disease.  Breast cancer.  Blood clots.  Diabetes.  Osteoporosis. Follow these instructions at home: Lifestyle  Do not use any products that contain nicotine or tobacco, such as cigarettes and e-cigarettes. If you need help quitting, ask your health care provider.  Get at least 30 minutes of physical activity on 5 or more days each week.  Avoid alcoholic and caffeinated beverages, as well as spicy foods. This may help prevent hot flashes.  Get 7-8 hours of sleep each night.  If you have hot flashes, try: ? Dressing in layers. ? Avoiding things that may  trigger hot flashes, such as spicy food, warm places, or stress. ? Taking slow, deep breaths when a hot flash starts. ? Keeping a fan  in your home and office.  Find ways to manage stress, such as deep breathing, meditation, or journaling.  Consider going to group therapy with other women who are having menopause symptoms. Ask your health care provider about recommended group therapy meetings. Eating and drinking  Eat a healthy, balanced diet that contains whole grains, lean protein, low-fat dairy, and plenty of fruits and vegetables.  Your health care provider may recommend adding more soy to your diet. Foods that contain soy include tofu, tempeh, and soy milk.  Eat plenty of foods that contain calcium and vitamin D for bone health. Items that are rich in calcium include low-fat milk, yogurt, beans, almonds, sardines, broccoli, and kale. Medicines  Take over-the-counter and prescription medicines only as told by your health care provider.  Talk with your health care provider before starting any herbal supplements. If prescribed, take vitamins and supplements as told by your health care provider. These may include: ? Calcium. Women age 62 and older should get 1,200 mg (milligrams) of calcium every day. ? Vitamin D. Women need 600-800 International Units of vitamin D each day. ? Vitamins B12 and B6. Aim for 50 micrograms of B12 and 1.5 mg of B6 each day. General instructions  Keep track of your menstrual periods, including: ? When they occur. ? How heavy they are and how long they last. ? How much time passes between periods.  Keep track of your symptoms, noting when they start, how often you have them, and how long they last.  Use vaginal lubricants or moisturizers to help with vaginal dryness and improve comfort during sex.  Keep all follow-up visits as told by your health care provider. This is important. This includes any group therapy or counseling. Contact a health care provider  if:  You are still having menstrual periods after age 68.  You have pain during sex.  You have not had a period for 12 months and you develop vaginal bleeding. Get help right away if:  You have: ? Severe depression. ? Excessive vaginal bleeding. ? Pain when you urinate. ? A fast or irregular heart beat (palpitations). ? Severe headaches. ? Abdomen (abdominal) pain or severe indigestion.  You fell and you think you have a broken bone.  You develop leg or chest pain.  You develop vision problems.  You feel a lump in your breast. Summary  Menopause is the normal time of life when menstrual periods stop completely. It is usually confirmed by 12 months without a menstrual period.  The transition to menopause (perimenopause) most often happens between the ages of 62 and 22.  Symptoms can be managed through medicines, lifestyle changes, and complementary therapies such as acupuncture.  Eat a balanced diet that is rich in nutrients to promote bone health and heart health and to manage symptoms during menopause. This information is not intended to replace advice given to you by your health care provider. Make sure you discuss any questions you have with your health care provider. Document Revised: 04/13/2017 Document Reviewed: 06/03/2016 Elsevier Patient Education  2020 [employer].

## 2019-11-25 NOTE — Progress Notes (Signed)
Pt present for annual exam. Pt stated that she was doing well and denies any issues at this time.  

## 2020-01-02 ENCOUNTER — Other Ambulatory Visit: Payer: Self-pay | Admitting: Nurse Practitioner

## 2020-01-02 ENCOUNTER — Telehealth: Payer: Self-pay | Admitting: Nurse Practitioner

## 2020-01-02 MED ORDER — CYCLOBENZAPRINE HCL 10 MG PO TABS: 10.0000 mg | ORAL_TABLET | Freq: Three times a day (TID) | ORAL | 0 refills | Status: DC | PRN

## 2020-01-02 NOTE — Telephone Encounter (Signed)
Spoke to patient on telephone, she reports symptoms similar to in 2017.  Last kidney stone was in 2017.  Has Flomax at home, 2 weeks worth, recommend she start taking this daily and will send in muscle relaxer (Flexeril) for her.  She reports she passed it in 2017.  Discussed with her if worsening symptoms over weekend then immediately go to urgent care and if ongoing symptoms Monday call office to schedule appointment ASAP.  She agrees with this plan and reports understanding.

## 2020-01-02 NOTE — Telephone Encounter (Signed)
Copied from [place] [id number]. Topic: General - Inquiry >> Jan 02, 2020  2:22 PM Mathis Bud wrote: Reason for CRM: Patient is requesting to get medication due to possible kidney stone.  Patient states that it just hit her today.  Patient does not want an appt she wants just wants medication today. Call back [place], [place]  Phone:  [phone] Fax:  [phone]

## 2020-01-07 ENCOUNTER — Ambulatory Visit
Admission: RE | Admit: 2020-01-07 | Discharge: 2020-01-07 | Disposition: A | Discharge status: Home / Self Care | Payer: 59 | Source: Ambulatory Visit | Attending: Obstetrics and Gynecology | Admitting: Obstetrics and Gynecology

## 2020-01-07 ENCOUNTER — Other Ambulatory Visit: Payer: Self-pay

## 2020-01-07 DIAGNOSIS — Z1231 Encounter for screening mammogram for malignant neoplasm of breast: Secondary | ICD-10-CM | POA: Diagnosis present

## 2020-02-03 ENCOUNTER — Encounter: Payer: 59 | Admitting: Nurse Practitioner

## 2020-03-13 ENCOUNTER — Encounter: Payer: Self-pay | Admitting: Nurse Practitioner

## 2020-03-18 ENCOUNTER — Other Ambulatory Visit: Payer: Self-pay

## 2020-03-18 ENCOUNTER — Ambulatory Visit (INDEPENDENT_AMBULATORY_CARE_PROVIDER_SITE_OTHER): Payer: 59 | Admitting: Nurse Practitioner

## 2020-03-18 ENCOUNTER — Encounter: Payer: Self-pay | Admitting: Nurse Practitioner

## 2020-03-18 VITALS — BP 108/68 | HR 62 | Temp 98.6°F | Ht 66.46 in | Wt 189.0 lb

## 2020-03-18 DIAGNOSIS — Z13220 Encounter for screening for lipoid disorders: Secondary | ICD-10-CM | POA: Diagnosis not present

## 2020-03-18 DIAGNOSIS — Z1329 Encounter for screening for other suspected endocrine disorder: Secondary | ICD-10-CM

## 2020-03-18 DIAGNOSIS — Z0000 Encounter for general adult medical examination without abnormal findings: Secondary | ICD-10-CM | POA: Diagnosis not present

## 2020-03-18 DIAGNOSIS — Z114 Encounter for screening for human immunodeficiency virus [HIV]: Secondary | ICD-10-CM

## 2020-03-18 DIAGNOSIS — R7309 Other abnormal glucose: Secondary | ICD-10-CM

## 2020-03-18 DIAGNOSIS — Z1159 Encounter for screening for other viral diseases: Secondary | ICD-10-CM

## 2020-03-18 LAB — UA/M W/RFLX CULTURE, ROUTINE
Bilirubin, UA: NEGATIVE
Glucose, UA: NEGATIVE
Ketones, UA: NEGATIVE
Leukocytes,UA: NEGATIVE
Nitrite, UA: NEGATIVE
Protein,UA: NEGATIVE
Specific Gravity, UA: 1.015 (ref 1.005–1.030)
Urobilinogen, Ur: 0.2 mg/dL (ref 0.2–1.0)
pH, UA: 6 (ref 5.0–7.5)

## 2020-03-18 LAB — MICROSCOPIC EXAMINATION
Bacteria, UA: NONE SEEN
WBC, UA: NONE SEEN /hpf (ref 0–5)

## 2020-03-18 NOTE — Progress Notes (Addendum)
BP 108/68   Pulse 62   Temp 98.6 F (37 C) (Oral)   Ht 5' 6.46" (1.688 m)   Wt 189 lb (85.7 kg)   LMP 01/01/2020 (Exact Date)   SpO2 99%   BMI 30.09 kg/m    Subjective:    Patient ID: Sydney Horne, female    DOB: 08/09/1965, 54 y.o.   MRN: [id number]  HPI: Sydney Horne is a 54 y.o. female presenting on 03/18/2020 for comprehensive medical examination. Current medical complaints include:none  She currently lives with: husband Menopausal Symptoms: no  Depression Screen done today and results listed below:  Depression screen [facility] 2/9 03/18/2020 01/22/2019 01/18/2018 01/12/2017  Decreased Interest 0 0 0 0  Down, Depressed, Hopeless 0 0 0 1  PHQ - 2 Score 0 0 0 1  Altered sleeping - 1 0 2  Tired, decreased energy - 0 0 0  Change in appetite - 1 0 0  Feeling bad or failure about yourself  - 0 0 0  Trouble concentrating - 0 0 1  Moving slowly or fidgety/restless - 0 0 0  Suicidal thoughts - 0 0 0  PHQ-9 Score - 2 0 4  Difficult doing work/chores - Not difficult at all - -    The patient does not have a history of falls. I did not complete a risk assessment for falls. A plan of care for falls was not documented.   Past Medical History:  Past Medical History:  Diagnosis Date  . Environmental allergies   . Headache    2x/mo  . Heart murmur    followed by PCP  . Kidney stones   . Neck pain    sees chiropractor for adjustment when having problems  . Vertigo 2017   2 episodes    Surgical History:  Past Surgical History:  Procedure Laterality Date  . bladder control surgery     TVT, performed in 2015  . COLONOSCOPY WITH PROPOFOL N/A 11/27/2016   Procedure: COLONOSCOPY WITH PROPOFOL;  Surgeon: Lucilla Lame, MD;  Location: [facility];  Service: Endoscopy;  Laterality: N/A;    Medications:  Current Outpatient Medications on File Prior to Visit  Medication Sig  . calcium citrate-vitamin D (CITRACAL+D) 315-200 MG-UNIT tablet Take 1 tablet by mouth daily.  .  Cranberry (THERACRAN ONE PO) Take 1 tablet by mouth daily.  . cyclobenzaprine (FLEXERIL) 10 MG tablet Take 1 tablet (10 mg total) by mouth 3 (three) times daily as needed for muscle spasms.  Marland Kitchen estradiol (ESTRACE) 0.1 MG/GM vaginal cream INSERT 1 APPLICATORFUL  VAGINALLY TWICE WEEKLY  . fluticasone (FLONASE) 50 MCG/ACT nasal spray Place 2 sprays into both nostrils as needed.   Marland Kitchen ibuprofen (ADVIL,MOTRIN) 200 MG tablet Take 200 mg by mouth every 6 (six) hours as needed for headache.  . Multiple Vitamin (MULTIVITAMIN) tablet Take 1 tablet by mouth daily.  Marland Kitchen TAMSULOSIN HCL PO    No current facility-administered medications on file prior to visit.    Allergies:  Allergies  Allergen Reactions  . Other Nausea And Vomiting    sauerkraut  . Zithromax [Azithromycin] Diarrhea       . Adhesive [Tape] Rash    Band-aids    Social History:  Social History   Socioeconomic History  . Marital status: Married    Spouse name: Not on file  . Number of children: Not on file  . Years of education: Not on file  . Highest education level: Not on file  Occupational  History  . Not on file  Tobacco Use  . Smoking status: Never Smoker  . Smokeless tobacco: Never Used  Vaping Use  . Vaping Use: Never used  Substance and Sexual Activity  . Alcohol use: Yes    Alcohol/week: 0.0 standard drinks    Comment: 1 drink/mo  . Drug use: No  . Sexual activity: Yes    Birth control/protection: None  Other Topics Concern  . Not on file  Social History Narrative  . Not on file   Social Determinants of Health   Financial Resource Strain:   . Difficulty of Paying Living Expenses: Not on file  Food Insecurity:   . Worried About Charity fundraiser in the Last Year: Not on file  . Ran Out of Food in the Last Year: Not on file  Transportation Needs:   . Lack of Transportation (Medical): Not on file  . Lack of Transportation (Non-Medical): Not on file  Physical Activity:   . Days of Exercise per Week: Not  on file  . Minutes of Exercise per Session: Not on file  Stress:   . Feeling of Stress : Not on file  Social Connections:   . Frequency of Communication with Friends and Family: Not on file  . Frequency of Social Gatherings with Friends and Family: Not on file  . Attends Religious Services: Not on file  . Active Member of Clubs or Organizations: Not on file  . Attends Archivist Meetings: Not on file  . Marital Status: Not on file  Intimate Partner Violence:   . Fear of Current or Ex-Partner: Not on file  . Emotionally Abused: Not on file  . Physically Abused: Not on file  . Sexually Abused: Not on file   Social History   Tobacco Use  Smoking Status Never Smoker  Smokeless Tobacco Never Used   Social History   Substance and Sexual Activity  Alcohol Use Yes  . Alcohol/week: 0.0 standard drinks   Comment: 1 drink/mo    Family History:  Family History  Problem Relation Age of Onset  . Breast cancer Mother 38  . Cancer Mother        breast  . Cancer Father        prostate  . Breast cancer Sister 35  . Cancer Sister        breast  . Diabetes Sister   . Colon cancer Sister   . Breast cancer Maternal Aunt   . Breast cancer Paternal Aunt   . Breast cancer Paternal Grandmother   . Cancer Paternal Grandmother        breast  . Diabetes Maternal Grandmother   . Heart attack Maternal Grandfather   . Diabetes Sister   . Ovarian cancer Neg Hx     Past medical history, surgical history, medications, allergies, family history and social history reviewed with patient today and changes made to appropriate areas of the chart.   Review of Systems - negative All other ROS negative except what is listed above and in the HPI.      Objective:    BP 108/68   Pulse 62   Temp 98.6 F (37 C) (Oral)   Ht 5' 6.46" (1.688 m)   Wt 189 lb (85.7 kg)   LMP 01/01/2020 (Exact Date)   SpO2 99%   BMI 30.09 kg/m   Wt Readings from Last 3 Encounters:  03/18/20 189 lb (85.7  kg)  11/25/19 194 lb 8 oz (88.2 kg)  01/22/19 193 lb (87.5 kg)    Physical Exam Vitals and nursing note reviewed.  Constitutional:      General: She is awake. She is not in acute distress.    Appearance: She is well-developed, well-groomed and overweight. She is not ill-appearing.  HENT:     Head: Normocephalic.     Right Ear: Hearing, tympanic membrane, ear canal and external ear normal. No drainage.     Left Ear: Hearing, tympanic membrane, ear canal and external ear normal. No drainage.     Nose: Nose normal.     Mouth/Throat:     Mouth: Mucous membranes are moist.     Pharynx: Uvula midline.  Eyes:     General: Lids are normal.        Right eye: No discharge.        Left eye: No discharge.     Extraocular Movements: Extraocular movements intact.     Conjunctiva/sclera: Conjunctivae normal.     Pupils: Pupils are equal, round, and reactive to light.     Visual Fields: Right eye visual fields normal and left eye visual fields normal.  Neck:     Thyroid: No thyromegaly.     Vascular: No carotid bruit.  Cardiovascular:     Rate and Rhythm: Normal rate and regular rhythm.     Heart sounds: Normal heart sounds. No murmur heard.  No gallop.   Pulmonary:     Effort: Pulmonary effort is normal. No accessory muscle usage or respiratory distress.     Breath sounds: Normal breath sounds.  Chest:     Breasts:        Right: Normal.        Left: Normal.  Abdominal:     General: Bowel sounds are normal.     Palpations: Abdomen is soft. There is no hepatomegaly or splenomegaly.     Tenderness: There is no abdominal tenderness.  Musculoskeletal:     Cervical back: Normal range of motion and neck supple.     Right lower leg: No edema.     Left lower leg: No edema.  Lymphadenopathy:     Head:     Right side of head: No submental, submandibular, tonsillar, preauricular or posterior auricular adenopathy.     Left side of head: No submental, submandibular, tonsillar, preauricular or  posterior auricular adenopathy.     Cervical: No cervical adenopathy.     Upper Body:     Right upper body: No supraclavicular, axillary or pectoral adenopathy.     Left upper body: No supraclavicular, axillary or pectoral adenopathy.  Skin:    General: Skin is warm and dry.  Neurological:     Mental Status: She is alert and oriented to person, place, and time.     Cranial Nerves: Cranial nerves are intact.     Gait: Gait is intact.     Deep Tendon Reflexes: Reflexes are normal and symmetric.     Reflex Scores:      Brachioradialis reflexes are 2+ on the right side and 2+ on the left side.      Patellar reflexes are 2+ on the right side and 2+ on the left side. Psychiatric:        Attention and Perception: Attention normal.        Mood and Affect: Mood normal.        Speech: Speech normal.        Behavior: Behavior normal. Behavior is cooperative.  Thought Content: Thought content normal.        Judgment: Judgment normal.     Results for orders placed or performed in visit on 01/22/19  CBC with Differential/Platelet  Result Value Ref Range   WBC 7.8 3.4 - 10.8 x10E3/uL   RBC 4.62 3.77 - 5.28 x10E6/uL   Hemoglobin 14.0 11.1 - 15.9 g/dL   Hematocrit 42.7 34.0 - 46.6 %   MCV 92 79 - 97 fL   MCH 30.3 26.6 - 33.0 pg   MCHC 32.8 31 - 35 g/dL   RDW 13.7 11.7 - 15.4 %   Platelets 190 150 - 450 x10E3/uL   Neutrophils 69 Not Estab. %   Lymphs 20 Not Estab. %   Monocytes 9 Not Estab. %   Eos 1 Not Estab. %   Basos 1 Not Estab. %   Neutrophils Absolute 5.4 1.40 - 7.00 x10E3/uL   Lymphocytes Absolute 1.6 0 - 3 x10E3/uL   Monocytes Absolute 0.7 0 - 0 x10E3/uL   EOS (ABSOLUTE) 0.1 0.0 - 0.4 x10E3/uL   Basophils Absolute 0.0 0 - 0 x10E3/uL   Immature Granulocytes 0 Not Estab. %   Immature Grans (Abs) 0.0 0.0 - 0.1 x10E3/uL  Comprehensive metabolic panel  Result Value Ref Range   Glucose 98 65 - 99 mg/dL   BUN 14 6 - 24 mg/dL   Creatinine, Ser 0.94 0.57 - 1.00 mg/dL   GFR  calc non Af Amer 69 >59 mL/min/1.73   GFR calc Af Amer 80 >59 mL/min/1.73   BUN/Creatinine Ratio 15 9 - 23   Sodium 140 134 - 144 mmol/L   Potassium 4.8 3.5 - 5.2 mmol/L   Chloride 101 96 - 106 mmol/L   CO2 26 20 - 29 mmol/L   Calcium 10.0 8.7 - 10.2 mg/dL   Total Protein 6.4 6.0 - 8.5 g/dL   Albumin 4.5 3.8 - 4.9 g/dL   Globulin, Total 1.9 1.5 - 4.5 g/dL   Albumin/Globulin Ratio 2.4 (H) 1.2 - 2.2   Bilirubin Total 0.3 0.0 - 1.2 mg/dL   Alkaline Phosphatase 81 39 - 117 IU/L   AST 19 0 - 40 IU/L   ALT 19 0 - 32 IU/L  Lipid Panel w/o Chol/HDL Ratio  Result Value Ref Range   Cholesterol, Total 136 100 - 199 mg/dL   Triglycerides 170 (H) 0 - 149 mg/dL   HDL 47 >39 mg/dL   VLDL Cholesterol Cal 29 5 - 40 mg/dL   LDL Chol Calc (NIH) 60 0 - 99 mg/dL  TSH  Result Value Ref Range   TSH 2.230 0.450 - 4.500 uIU/mL  HgB A1c  Result Value Ref Range   Hgb A1c MFr Bld 5.8 (H) 4.8 - 5.6 %   Est. average glucose Bld gHb Est-mCnc 120 mg/dL      Assessment & Plan:   Problem List Items Addressed This Visit      Other   Elevated hemoglobin A1c    Recheck A1C.      Relevant Orders   HgB A1c    Other Visit Diagnoses    Encounter for annual physical exam    -  Primary   Annual labs today to include CBC, CMP, TSH, lipid.  Overall healthy female on exam.   Relevant Orders   CBC with Differential/Platelet   Comprehensive metabolic panel   UA/M w/rflx Culture, Routine   Thyroid disorder screen       TSH on labs today   Relevant Orders  TSH   Screening cholesterol level       Lipid panel on labs today.   Relevant Orders   Lipid Panel w/o Chol/HDL Ratio   Need for hepatitis C screening test       Hep C screening x 1 per guideline recommendations, performed today.   Relevant Orders   Hepatitis C antibody   Encounter for screening for HIV       HIV screening x 1 per guideline recommendations, performed today.   Relevant Orders   HIV Antibody (routine testing w rflx)       Follow  up plan: Return in about 1 year (around 03/18/2021) for Annual physical.   LABORATORY TESTING:  - Pap smear: up to date  IMMUNIZATIONS:   - Tdap: Tetanus vaccination status reviewed: last tetanus booster within 10 years. - Influenza: Administered today - Pneumovax: Not applicable - Prevnar: Not applicable - HPV: Not applicable - Zostavax vaccine: Refused  SCREENING: -Mammogram: Up to date  - Colonoscopy: Up to date  - Bone Density: Not applicable  -Hearing Test: Not applicable  -Spirometry: Not applicable   PATIENT COUNSELING:   Advised to take 1 mg of folate supplement per day if capable of pregnancy.   Sexuality: Discussed sexually transmitted diseases, partner selection, use of condoms, avoidance of unintended pregnancy  and contraceptive alternatives.   Advised to avoid cigarette smoking.  I discussed with the patient that most people either abstain from alcohol or drink within safe limits (<=14/week and <=4 drinks/occasion for males, <=7/weeks and <= 3 drinks/occasion for females) and that the risk for alcohol disorders and other health effects rises proportionally with the number of drinks per week and how often a drinker exceeds daily limits.  Discussed cessation/primary prevention of drug use and availability of treatment for abuse.   Diet: Encouraged to adjust caloric intake to maintain  or achieve ideal body weight, to reduce intake of dietary saturated fat and total fat, to limit sodium intake by avoiding high sodium foods and not adding table salt, and to maintain adequate dietary potassium and calcium preferably from fresh fruits, vegetables, and low-fat dairy products.    stressed the importance of regular exercise  Injury prevention: Discussed safety belts, safety helmets, smoke detector, smoking near bedding or upholstery.   Dental health: Discussed importance of regular tooth brushing, flossing, and dental visits.    NEXT PREVENTATIVE PHYSICAL DUE IN 1  YEAR. Return in about 1 year (around 03/18/2021) for Annual physical.

## 2020-03-18 NOTE — Patient Instructions (Signed)
Healthy Eating Following a healthy eating pattern may help you to achieve and maintain a healthy body weight, reduce the risk of chronic disease, and live a long and productive life. It is important to follow a healthy eating pattern at an appropriate calorie level for your body. Your nutritional needs should be met primarily through food by choosing a variety of nutrient-rich foods. What are tips for following this plan? Reading food labels  Read labels and choose the following: ? Reduced or low sodium. ? Juices with 100% fruit juice. ? Foods with low saturated fats and high polyunsaturated and monounsaturated fats. ? Foods with whole grains, such as whole wheat, cracked wheat, brown rice, and wild rice. ? Whole grains that are fortified with folic acid. This is recommended for women who are pregnant or who want to become pregnant.  Read labels and avoid the following: ? Foods with a lot of added sugars. These include foods that contain brown sugar, corn sweetener, corn syrup, dextrose, fructose, glucose, high-fructose corn syrup, honey, invert sugar, lactose, malt syrup, maltose, molasses, raw sugar, sucrose, trehalose, or turbinado sugar.  Do not eat more than the following amounts of added sugar per day:  6 teaspoons (25 g) for women.  9 teaspoons (38 g) for men. ? Foods that contain processed or refined starches and grains. ? Refined grain products, such as white flour, degermed cornmeal, white bread, and white rice. Shopping  Choose nutrient-rich snacks, such as vegetables, whole fruits, and nuts. Avoid high-calorie and high-sugar snacks, such as potato chips, fruit snacks, and candy.  Use oil-based dressings and spreads on foods instead of solid fats such as butter, stick margarine, or cream cheese.  Limit pre-made sauces, mixes, and "instant" products such as flavored rice, instant noodles, and ready-made pasta.  Try more plant-protein sources, such as tofu, tempeh, black beans,  edamame, lentils, nuts, and seeds.  Explore eating plans such as the Mediterranean diet or vegetarian diet. Cooking  Use oil to saut or stir-fry foods instead of solid fats such as butter, stick margarine, or lard.  Try baking, boiling, grilling, or broiling instead of frying.  Remove the fatty part of meats before cooking.  Steam vegetables in water or broth. Meal planning   At meals, imagine dividing your plate into fourths: ? One-half of your plate is fruits and vegetables. ? One-fourth of your plate is whole grains. ? One-fourth of your plate is protein, especially lean meats, poultry, eggs, tofu, beans, or nuts.  Include low-fat dairy as part of your daily diet. Lifestyle  Choose healthy options in all settings, including home, work, school, restaurants, or stores.  Prepare your food safely: ? Wash your hands after handling raw meats. ? Keep food preparation surfaces clean by regularly washing with hot, soapy water. ? Keep raw meats separate from ready-to-eat foods, such as fruits and vegetables. ? Cook seafood, meat, poultry, and eggs to the recommended internal temperature. ? Store foods at safe temperatures. In general:  Keep cold foods at 59F (4.4C) or below.  Keep hot foods at 159F (60C) or above.  Keep your freezer at [facility] (-17.8C) or below.  Foods are no longer safe to eat when they have been between the temperatures of 40-159F (4.4-60C) for more than 2 hours. What foods should I eat? Fruits Aim to eat 2 cup-equivalents of fresh, canned (in natural juice), or frozen fruits each day. Examples of 1 cup-equivalent of fruit include 1 small apple, 8 large strawberries, 1 cup canned fruit,  cup  dried fruit, or 1 cup 100% juice. Vegetables Aim to eat 2-3 cup-equivalents of fresh and frozen vegetables each day, including different varieties and colors. Examples of 1 cup-equivalent of vegetables include 2 medium carrots, 2 cups raw, leafy greens, 1 cup chopped  vegetable (raw or cooked), or 1 medium baked potato. Grains Aim to eat 6 ounce-equivalents of whole grains each day. Examples of 1 ounce-equivalent of grains include 1 slice of bread, 1 cup ready-to-eat cereal, 3 cups popcorn, or  cup cooked rice, pasta, or cereal. Meats and other proteins Aim to eat 5-6 ounce-equivalents of protein each day. Examples of 1 ounce-equivalent of protein include 1 egg, 1/2 cup nuts or seeds, or 1 tablespoon (16 g) peanut butter. A cut of meat or fish that is the size of a deck of cards is about 3-4 ounce-equivalents.  Of the protein you eat each week, try to have at least 8 ounces come from seafood. This includes salmon, trout, herring, and anchovies. Dairy Aim to eat 3 cup-equivalents of fat-free or low-fat dairy each day. Examples of 1 cup-equivalent of dairy include 1 cup (240 mL) milk, 8 ounces (250 g) yogurt, 1 ounces (44 g) natural cheese, or 1 cup (240 mL) fortified soy milk. Fats and oils  Aim for about 5 teaspoons (21 g) per day. Choose monounsaturated fats, such as canola and olive oils, avocados, peanut butter, and most nuts, or polyunsaturated fats, such as sunflower, corn, and soybean oils, walnuts, pine nuts, sesame seeds, sunflower seeds, and flaxseed. Beverages  Aim for six 8-oz glasses of water per day. Limit coffee to three to five 8-oz cups per day.  Limit caffeinated beverages that have added calories, such as soda and energy drinks.  Limit alcohol intake to no more than 1 drink a day for nonpregnant women and 2 drinks a day for men. One drink equals 12 oz of beer (355 mL), 5 oz of wine (148 mL), or 1 oz of hard liquor (44 mL). Seasoning and other foods  Avoid adding excess amounts of salt to your foods. Try flavoring foods with herbs and spices instead of salt.  Avoid adding sugar to foods.  Try using oil-based dressings, sauces, and spreads instead of solid fats. This information is based on general [place] nutrition guidelines. For more  information, visit [employer]. Exact amounts may vary based on your nutrition needs. Summary  A healthy eating plan may help you to maintain a healthy weight, reduce the risk of chronic diseases, and stay active throughout your life.  Plan your meals. Make sure you eat the right portions of a variety of nutrient-rich foods.  Try baking, boiling, grilling, or broiling instead of frying.  Choose healthy options in all settings, including home, work, school, restaurants, or stores. This information is not intended to replace advice given to you by your health care provider. Make sure you discuss any questions you have with your health care provider. Document Revised: 08/13/2017 Document Reviewed: 08/13/2017 Elsevier Patient Education  [place].

## 2020-03-18 NOTE — Assessment & Plan Note (Signed)
Recheck A1C 

## 2020-03-19 ENCOUNTER — Other Ambulatory Visit: Payer: Self-pay | Admitting: Nurse Practitioner

## 2020-03-19 DIAGNOSIS — R3129 Other microscopic hematuria: Secondary | ICD-10-CM

## 2020-03-19 LAB — CBC WITH DIFFERENTIAL/PLATELET
Basophils Absolute: 0.1 10*3/uL (ref 0.0–0.2)
Basos: 1 %
EOS (ABSOLUTE): 0 10*3/uL (ref 0.0–0.4)
Eos: 1 %
Hematocrit: 43.1 % (ref 34.0–46.6)
Hemoglobin: 14 g/dL (ref 11.1–15.9)
Immature Grans (Abs): 0 10*3/uL (ref 0.0–0.1)
Immature Granulocytes: 0 %
Lymphocytes Absolute: 1.6 10*3/uL (ref 0.7–3.1)
Lymphs: 26 %
MCH: 30.2 pg (ref 26.6–33.0)
MCHC: 32.5 g/dL (ref 31.5–35.7)
MCV: 93 fL (ref 79–97)
Monocytes Absolute: 0.6 10*3/uL (ref 0.1–0.9)
Monocytes: 10 %
Neutrophils Absolute: 3.9 10*3/uL (ref 1.4–7.0)
Neutrophils: 62 %
Platelets: 216 10*3/uL (ref 150–450)
RBC: 4.64 x10E6/uL (ref 3.77–5.28)
RDW: 13.2 % (ref 11.7–15.4)
WBC: 6.3 10*3/uL (ref 3.4–10.8)

## 2020-03-19 LAB — HEPATITIS C ANTIBODY: Hep C Virus Ab: <0.1 s/co ratio (ref 0.0–0.9)

## 2020-03-19 LAB — COMPREHENSIVE METABOLIC PANEL
ALT: 27 IU/L (ref 0–32)
AST: 27 IU/L (ref 0–40)
Albumin/Globulin Ratio: 2.1 (ref 1.2–2.2)
Albumin: 4.5 g/dL (ref 3.8–4.9)
Alkaline Phosphatase: 77 IU/L (ref 44–121)
BUN/Creatinine Ratio: 13 (ref 9–23)
BUN: 12 mg/dL (ref 6–24)
Bilirubin Total: 0.5 mg/dL (ref 0.0–1.2)
CO2: 27 mmol/L (ref 20–29)
Calcium: 9.6 mg/dL (ref 8.7–10.2)
Chloride: 103 mmol/L (ref 96–106)
Creatinine, Ser: 0.9 mg/dL (ref 0.57–1.00)
GFR calc Af Amer: 83 mL/min/{1.73_m2} (ref >59)
GFR calc non Af Amer: 72 mL/min/{1.73_m2} (ref >59)
Globulin, Total: 2.1 g/dL (ref 1.5–4.5)
Glucose: 94 mg/dL (ref 65–99)
Potassium: 4.5 mmol/L (ref 3.5–5.2)
Sodium: 142 mmol/L (ref 134–144)
Total Protein: 6.6 g/dL (ref 6.0–8.5)

## 2020-03-19 LAB — LIPID PANEL W/O CHOL/HDL RATIO
Cholesterol, Total: 163 mg/dL (ref 100–199)
HDL: 48 mg/dL (ref >39)
LDL Chol Calc (NIH): 89 mg/dL (ref 0–99)
Triglycerides: 149 mg/dL (ref 0–149)
VLDL Cholesterol Cal: 26 mg/dL (ref 5–40)

## 2020-03-19 LAB — HEMOGLOBIN A1C
Est. average glucose Bld gHb Est-mCnc: 123 mg/dL
Hgb A1c MFr Bld: 5.9 % — ABNORMAL HIGH (ref 4.8–5.6)

## 2020-03-19 LAB — TSH: TSH: 2.13 u[IU]/mL (ref 0.450–4.500)

## 2020-03-19 LAB — HIV ANTIBODY (ROUTINE TESTING W REFLEX): HIV Screen 4th Generation wRfx: NONREACTIVE

## 2020-03-19 NOTE — Progress Notes (Signed)
Contacted via MyChart  Good evening Sydney Horne, your labs have returned: - CBC is normal with no anemia - Kidney and liver function + electrolytes are normal. - Cholesterol levels look great. - thyroid testing is normal and HIV and Hep C are negative. - A1C remains in prediabetic range, continue focus on diet and regular activity. - Urine did show 1+ blood, are you still having menstrual cycles?  If so were you having one at physical?  If not then I would like to recheck you urine in about 6 weeks in office.  Please schedule out patient lab visit only.  Any questions? Keep being awesome!!  Thank you for allowing me to participate in your care. Kindest regards, Raymel Cull

## 2020-03-22 ENCOUNTER — Encounter: Payer: Self-pay | Admitting: Nurse Practitioner

## 2020-03-22 ENCOUNTER — Other Ambulatory Visit: Payer: Self-pay | Admitting: Nurse Practitioner

## 2020-03-22 MED ORDER — ONETOUCH VERIO W/DEVICE KIT: PACK | 0 refills | Status: AC

## 2020-03-22 MED ORDER — ONETOUCH ULTRASOFT LANCETS MISC
12 refills | Status: DC
Start: 2020-03-22 — End: 2021-04-18

## 2020-03-22 MED ORDER — ONETOUCH VERIO VI STRP: ORAL_STRIP | 12 refills | Status: DC

## 2020-04-19 ENCOUNTER — Ambulatory Visit: Payer: 59 | Attending: Internal Medicine

## 2020-04-19 DIAGNOSIS — Z23 Encounter for immunization: Secondary | ICD-10-CM

## 2020-04-19 NOTE — Progress Notes (Signed)
   Covid-19 Vaccination Clinic  Name:  Sydney Horne    MRN: [id number] DOB: February 23, 1966  04/19/2020  Sydney Horne was observed post Covid-19 immunization for 15 minutes without incident. She was provided with Vaccine Information Sheet and instruction to access the V-Safe system.   Sydney Horne was instructed to call 911 with any severe reactions post vaccine: Marland Kitchen Difficulty breathing  . Swelling of face and throat  . A fast heartbeat  . A bad rash all over body  . Dizziness and weakness   Immunizations Administered    No immunizations on file.

## 2020-11-03 ENCOUNTER — Other Ambulatory Visit: Payer: Self-pay

## 2020-11-03 ENCOUNTER — Telehealth (INDEPENDENT_AMBULATORY_CARE_PROVIDER_SITE_OTHER): Payer: 59 | Admitting: Nurse Practitioner

## 2020-11-03 DIAGNOSIS — U071 COVID-19: Secondary | ICD-10-CM

## 2020-11-03 MED ORDER — MOLNUPIRAVIR EUA 200MG CAPSULE
4.0000 | ORAL_CAPSULE | Freq: Two times a day (BID) | ORAL | 0 refills | Status: AC
  Filled 2020-11-03: qty 40, 5d supply, fill #0

## 2020-11-03 NOTE — Progress Notes (Signed)
There were no vitals taken for this visit.   Subjective:    Patient ID: Sydney Horne, female    DOB: 1966/01/11, 55 y.o.   MRN: [id number]  HPI: Sydney Horne is a 55 y.o. female  Chief Complaint  Patient presents with   Covid Positive    Started feeling bad on Saturday, sore throat and started running a fever last night   COVID POSITIVE Patient took a home test today.  Symptoms started on Saturday. Patient's symptoms started with a congestion, cough, sore throat and temperature 100.5.  Denies any SOB, wheezing, and chest pain.   Relevant past medical, surgical, family and social history reviewed and updated as indicated. Interim medical history since our last visit reviewed. Allergies and medications reviewed and updated.  Review of Systems  Constitutional:  Positive for fever.  HENT:  Positive for congestion and sore throat.   Respiratory:  Positive for cough. Negative for shortness of breath and wheezing.   Cardiovascular:  Negative for chest pain.   Per HPI unless specifically indicated above     Objective:    There were no vitals taken for this visit.  Wt Readings from Last 3 Encounters:  03/18/20 189 lb (85.7 kg)  11/25/19 194 lb 8 oz (88.2 kg)  01/22/19 193 lb (87.5 kg)    Physical Exam Vitals and nursing note reviewed.  HENT:     Head: Normocephalic.     Right Ear: Hearing normal.     Left Ear: Hearing normal.     Nose: Nose normal.  Eyes:     Pupils: Pupils are equal, round, and reactive to light.  Pulmonary:     Effort: Pulmonary effort is normal. No respiratory distress.  Neurological:     Mental Status: She is alert.  Psychiatric:        Mood and Affect: Mood normal.        Behavior: Behavior normal.        Thought Content: Thought content normal.        Judgment: Judgment normal.    Results for orders placed or performed in visit on 03/18/20  Microscopic Examination   Urine  Result Value Ref Range   WBC, UA None seen 0 - 5 /hpf   RBC 0-2 0  - 2 /hpf   Epithelial Cells (non renal) 0-10 0 - 10 /hpf   Bacteria, UA None seen None seen/Few  CBC with Differential/Platelet  Result Value Ref Range   WBC 6.3 3.4 - 10.8 x10E3/uL   RBC 4.64 3.77 - 5.28 x10E6/uL   Hemoglobin 14.0 11.1 - 15.9 g/dL   Hematocrit 43.1 34.0 - 46.6 %   MCV 93 79 - 97 fL   MCH 30.2 26.6 - 33.0 pg   MCHC 32.5 31.5 - 35.7 g/dL   RDW 13.2 11.7 - 15.4 %   Platelets 216 150 - 450 x10E3/uL   Neutrophils 62 Not Estab. %   Lymphs 26 Not Estab. %   Monocytes 10 Not Estab. %   Eos 1 Not Estab. %   Basos 1 Not Estab. %   Neutrophils Absolute 3.9 1.4 - 7.0 x10E3/uL   Lymphocytes Absolute 1.6 0.7 - 3.1 x10E3/uL   Monocytes Absolute 0.6 0.1 - 0.9 x10E3/uL   EOS (ABSOLUTE) 0.0 0.0 - 0.4 x10E3/uL   Basophils Absolute 0.1 0.0 - 0.2 x10E3/uL   Immature Granulocytes 0 Not Estab. %   Immature Grans (Abs) 0.0 0.0 - 0.1 x10E3/uL  Comprehensive metabolic panel  Result  Value Ref Range   Glucose 94 65 - 99 mg/dL   BUN 12 6 - 24 mg/dL   Creatinine, Ser 0.90 0.57 - 1.00 mg/dL   GFR calc non Af Amer 72 >59 mL/min/1.73   GFR calc Af Amer 83 >59 mL/min/1.73   BUN/Creatinine Ratio 13 9 - 23   Sodium 142 134 - 144 mmol/L   Potassium 4.5 3.5 - 5.2 mmol/L   Chloride 103 96 - 106 mmol/L   CO2 27 20 - 29 mmol/L   Calcium 9.6 8.7 - 10.2 mg/dL   Total Protein 6.6 6.0 - 8.5 g/dL   Albumin 4.5 3.8 - 4.9 g/dL   Globulin, Total 2.1 1.5 - 4.5 g/dL   Albumin/Globulin Ratio 2.1 1.2 - 2.2   Bilirubin Total 0.5 0.0 - 1.2 mg/dL   Alkaline Phosphatase 77 44 - 121 IU/L   AST 27 0 - 40 IU/L   ALT 27 0 - 32 IU/L  Lipid Panel w/o Chol/HDL Ratio  Result Value Ref Range   Cholesterol, Total 163 100 - 199 mg/dL   Triglycerides 149 0 - 149 mg/dL   HDL 48 >39 mg/dL   VLDL Cholesterol Cal 26 5 - 40 mg/dL   LDL Chol Calc (NIH) 89 0 - 99 mg/dL  TSH  Result Value Ref Range   TSH 2.130 0.450 - 4.500 uIU/mL  Hepatitis C antibody  Result Value Ref Range   Hep C Virus Ab <0.1 0.0 - 0.9 s/co  ratio  HIV Antibody (routine testing w rflx)  Result Value Ref Range   HIV Screen 4th Generation wRfx Non Reactive Non Reactive  HgB A1c  Result Value Ref Range   Hgb A1c MFr Bld 5.9 (H) 4.8 - 5.6 %   Est. average glucose Bld gHb Est-mCnc 123 mg/dL  UA/M w/rflx Culture, Routine   Specimen: Urine   Urine  Result Value Ref Range   Specific Gravity, UA 1.015 1.005 - 1.030   pH, UA 6.0 5.0 - 7.5   Color, UA Yellow Yellow   Appearance Ur Clear Clear   Leukocytes,UA Negative Negative   Protein,UA Negative Negative/Trace   Glucose, UA Negative Negative   Ketones, UA Negative Negative   RBC, UA 1+ (A) Negative   Bilirubin, UA Negative Negative   Urobilinogen, Ur 0.2 0.2 - 1.0 mg/dL   Nitrite, UA Negative Negative   Microscopic Examination See below:       Assessment & Plan:   Problem List Items Addressed This Visit   None Visit Diagnoses     COVID-19    -  Primary   Molnupiravir sent to the pharmacy. Discussed proper use of the medication. Reviewed ER precautions and isolation precautions. FU if symptoms worsen.   Relevant Medications   molnupiravir EUA 200 mg CAPS        Follow up plan: Return if symptoms worsen or fail to improve.  This visit was completed via MyChart due to the restrictions of the COVID-19 pandemic. All issues as above were discussed and addressed. Physical exam was done as above through visual confirmation on MyChart. If it was felt that the patient should be evaluated in the office, they were directed there. The patient verbally consented to this visit. Location of the patient: Home Location of the provider: Office Those involved with this call:  Provider: Jon Billings, NP CMA: Tiffany Reel, CMA Front Desk/Registration: Jill Side Time spent on call: 15 minutes with patient face to face via video conference. More than 50% of this  time was spent in counseling and coordination of care. 20 minutes total spent in review of patient's record and  preparation of their chart.

## 2020-11-04 ENCOUNTER — Encounter: Payer: Self-pay | Admitting: Nurse Practitioner

## 2020-11-12 ENCOUNTER — Ambulatory Visit: Payer: Self-pay | Admitting: *Deleted

## 2020-11-12 NOTE — Telephone Encounter (Signed)
Attempted to call patient- left message to call office   Patient called in to say that she took a Covid test on 11/03/20 was positive had a fever on 11/02/20 but none since and took another test this morning 11/12/20 that is coming back positive. Confused because no fever since 11/04/20 Please advise Ph# [phone]   * POSITIVE VIRAL TEST: After a positive test, repeat tests are generally not recommended for 90 days (3 months). Reason: Even after it is safe to stop isolation (usually 5 days), tests may stay positive. Further, re-infection appears to be rare during the initial 90 days after symptom onset of the preceding infection. However, if you have new symptoms of COVID-19 within 14 days of exposure to someone with COVID-19, you should stay home (isolate).

## 2020-11-12 NOTE — Telephone Encounter (Signed)
Patient called in to say that she took a Covid test on 11/03/20 was positive had a fever on 11/02/20 but none since and took another test this morning 11/12/20 that is coming back positive. Confused because no fever since 11/04/20 Please advise Ph# [phone]   Instructed pt. That she can test positive up to 90 days out. States she no longer has any symptoms. Instructed pt. She can end quarantine. Verbalizes understanding.

## 2020-11-12 NOTE — Telephone Encounter (Signed)
Second attempt to call patient- left message to call office. 

## 2020-11-24 ENCOUNTER — Other Ambulatory Visit: Payer: Self-pay | Admitting: Obstetrics and Gynecology

## 2020-11-24 DIAGNOSIS — Z1231 Encounter for screening mammogram for malignant neoplasm of breast: Secondary | ICD-10-CM

## 2020-11-26 ENCOUNTER — Encounter: Payer: 59 | Admitting: Obstetrics and Gynecology

## 2020-11-30 NOTE — Patient Instructions (Signed)
Breast Self-Awareness Breast self-awareness is knowing how your breasts look and feel. Doing breast self-awareness is important. It allows you to catch a breast problem early while it is still small and can be treated. All women should do breast self-awareness, including women who have had breast implants. Tell your doctorif you notice a change in your breasts. What you need: A mirror. A well-lit room. How to do a breast self-exam A breast self-exam is one way to learn what is normal for your breasts and tocheck for changes. To do a breast self-exam: Look for changes  Take off all the clothes above your waist. Stand in front of a mirror in a room with good lighting. Put your hands on your hips. Push your hands down. Look at your breasts and nipples in the mirror to see if one breast or nipple looks different from the other. Check to see if: The shape of one breast is different. The size of one breast is different. There are wrinkles, dips, and bumps in one breast and not the other. Look at each breast for changes in the skin, such as: Redness. Scaly areas. Look for changes in your nipples, such as: Liquid around the nipples. Bleeding. Dimpling. Redness. A change in where the nipples are.  Feel for changes  Lie on your back on the floor. Feel each breast. To do this, follow these steps: Pick a breast to feel. Put the arm closest to that breast above your head. Use your other arm to feel the nipple area of your breast. Feel the area with the pads of your three middle fingers by making small circles with your fingers. For the first circle, press lightly. For the second circle, press harder. For the third circle, press even harder. Keep making circles with your fingers at the different pressures as you move down your breast. Stop when you feel your ribs. Move your fingers a little toward the center of your body. Start making circles with your fingers again, this time going up until  you reach your collarbone. Keep making up-and-down circles until you reach your armpit. Remember to keep using the three pressures. Feel the other breast in the same way. Sit or stand in the tub or shower. With soapy water on your skin, feel each breast the same way you did in step 2 when you were lying on the floor.  Write down what you find Writing down what you find can help you remember what to tell your doctor. Write down: What is normal for each breast. Any changes you find in each breast, including: The kind of changes you find. Whether you have pain. Size and location of any lumps. When you last had your menstrual period. General tips Check your breasts every month. If you are breastfeeding, the best time to check your breasts is after you feed your baby or after you use a breast pump. If you get menstrual periods, the best time to check your breasts is 5-7 days after your menstrual period is over. With time, you will become comfortable with the self-exam, and you will begin to know if there are changes in your breasts. Contact a doctor if you: See a change in the shape or size of your breasts or nipples. See a change in the skin of your breast or nipples, such as red or scaly skin. Have fluid coming from your nipples that is not normal. Find a lump or thick area that was not there before. Have pain in   your breasts. Have any concerns about your breast health. Summary Breast self-awareness includes looking for changes in your breasts, as well as feeling for changes within your breasts. Breast self-awareness should be done in front of a mirror in a well-lit room. You should check your breasts every month. If you get menstrual periods, the best time to check your breasts is 5-7 days after your menstrual period is over. Let your doctor know of any changes you see in your breasts, including changes in size, changes on the skin, pain or tenderness, or fluid from your nipples that is  not normal. This information is not intended to replace advice given to you by your health care provider. Make sure you discuss any questions you have with your healthcare provider. Document Revised: 12/18/2017 Document Reviewed: 12/18/2017 Elsevier Patient Education  2022 [employer]     Preventive Care 40-64 Years Old, Female Preventive care refers to lifestyle choices and visits with your health care provider that can promote health and wellness. This includes: A yearly physical exam. This is also called an annual wellness visit. Regular dental and eye exams. Immunizations. Screening for certain conditions. Healthy lifestyle choices, such as: Eating a healthy diet. Getting regular exercise. Not using drugs or products that contain nicotine and tobacco. Limiting alcohol use. What can I expect for my preventive care visit? Physical exam Your health care provider will check your: Height and weight. These may be used to calculate your BMI (body mass index). BMI is a measurement that tells if you are at a healthy weight. Heart rate and blood pressure. Body temperature. Skin for abnormal spots. Counseling Your health care provider may ask you questions about your: Past medical problems. Family's medical history. Alcohol, tobacco, and drug use. Emotional well-being. Home life and relationship well-being. Sexual activity. Diet, exercise, and sleep habits. Work and work environment. Access to firearms. Method of birth control. Menstrual cycle. Pregnancy history. What immunizations do I need?  Vaccines are usually given at various ages, according to a schedule. Your health care provider will recommend vaccines for you based on your age, medicalhistory, and lifestyle or other factors, such as travel or where you work. What tests do I need? Blood tests Lipid and cholesterol levels. These may be checked every 5 years, or more often if you are over 50 years old. Hepatitis C  test. Hepatitis B test. Screening Lung cancer screening. You may have this screening every year starting at age 55 if you have a 30-pack-year history of smoking and currently smoke or have quit within the past 15 years. Colorectal cancer screening. All adults should have this screening starting at age 50 and continuing until age 75. Your health care provider may recommend screening at age 45 if you are at increased risk. You will have tests every 1-10 years, depending on your results and the type of screening test. Diabetes screening. This is done by checking your blood sugar (glucose) after you have not eaten for a while (fasting). You may have this done every 1-3 years. Mammogram. This may be done every 1-2 years. Talk with your health care provider about when you should start having regular mammograms. This may depend on whether you have a family history of breast cancer. BRCA-related cancer screening. This may be done if you have a family history of breast, ovarian, tubal, or peritoneal cancers. Pelvic exam and Pap test. This may be done every 3 years starting at age 21. Starting at age 30, this may be done every   5 years if you have a Pap test in combination with an HPV test. Other tests STD (sexually transmitted disease) testing, if you are at risk. Bone density scan. This is done to screen for osteoporosis. You may have this scan if you are at high risk for osteoporosis. Talk with your health care provider about your test results, treatment options,and if necessary, the need for more tests. Follow these instructions at home: Eating and drinking  Eat a diet that includes fresh fruits and vegetables, whole grains, lean protein, and low-fat dairy products. Take vitamin and mineral supplements as recommended by your health care provider. Do not drink alcohol if: Your health care provider tells you not to drink. You are pregnant, may be pregnant, or are planning to become pregnant. If  you drink alcohol: Limit how much you have to 0-1 drink a day. Be aware of how much alcohol is in your drink. In the [place] one drink equals one 12 oz bottle of beer (355 mL), one 5 oz glass of wine (148 mL), or one 1 oz glass of hard liquor (44 mL).  Lifestyle Take daily care of your teeth and gums. Brush your teeth every morning and night with fluoride toothpaste. Floss one time each day. Stay active. Exercise for at least 30 minutes 5 or more days each week. Do not use any products that contain nicotine or tobacco, such as cigarettes, e-cigarettes, and chewing tobacco. If you need help quitting, ask your health care provider. Do not use drugs. If you are sexually active, practice safe sex. Use a condom or other form of protection to prevent STIs (sexually transmitted infections). If you do not wish to become pregnant, use a form of birth control. If you plan to become pregnant, see your health care provider for a prepregnancy visit. If told by your health care provider, take low-dose aspirin daily starting at age 50. Find healthy ways to cope with stress, such as: Meditation, yoga, or listening to music. Journaling. Talking to a trusted person. Spending time with friends and family. Safety Always wear your seat belt while driving or riding in a vehicle. Do not drive: If you have been drinking alcohol. Do not ride with someone who has been drinking. When you are tired or distracted. While texting. Wear a helmet and other protective equipment during sports activities. If you have firearms in your house, make sure you follow all gun safety procedures. What's next? Visit your health care provider once a year for an annual wellness visit. Ask your health care provider how often you should have your eyes and teeth checked. Stay up to date on all vaccines. This information is not intended to replace advice given to you by your health care provider. Make sure you discuss any questions you  have with your healthcare provider. Document Revised: 02/03/2020 Document Reviewed: 01/10/2018 Elsevier Patient Education  2022 [employer]  

## 2020-11-30 NOTE — Progress Notes (Signed)
GYNECOLOGY ANNUAL PHYSICAL EXAM PROGRESS NOTE  Subjective:    Sydney Horne is a 55 y.o. G24P2002 female who presents for an annual exam. The patient has no complaints today. The patient is sexually active. The patient is thinks she is menopausal but unsure as she has had intermittent bleeding with use of Estrace, denies bleeding.  She does not oral replacement hormone therapy. The patient participates in regular exercise: yes. Has the patient ever been transfused or tattooed?: no. The patient reports that there is not domestic violence in her life.   Reports that she discontinued the Estrace cream ~ 2 months ago as she was noting vaginal bleeding.  Has not noticed any other issues since that time.    Menstrual History: Menarche age: 29.  No LMP recorded.      Gynecologic History:  Contraception: none. Patient is likely post menopausal status History of STI's: Denies Last Pap: 11/22/2018. Results were: normal.  Denies h/o abnormal pap smears. Last mammogram: 01/07/2020. Results were: normal Last colonoscopy 11/27/2016. Results were: normal.    OB History  Gravida Para Term Preterm AB Living  '2 2 2 ' 0 0 2  SAB IAB Ectopic Multiple Live Births  0 0 0 0 2    # Outcome Date GA Lbr Len/2nd Weight Sex Delivery Anes PTL Lv  2 Term 1999   8 lb 12.8 oz (3.992 kg) M Vag-Spont   LIV  1 Term 1997   8 lb 1.6 oz (3.674 kg) F Vag-Spont   LIV    Past Medical History:  Diagnosis Date   Environmental allergies    Headache    2x/mo   Heart murmur    followed by PCP   Kidney stones    Neck pain    sees chiropractor for adjustment when having problems   Vertigo 2017   2 episodes    Past Surgical History:  Procedure Laterality Date   bladder control surgery     TVT, performed in 2015   COLONOSCOPY WITH PROPOFOL N/A 11/27/2016   Procedure: COLONOSCOPY WITH PROPOFOL;  Surgeon: Lucilla Lame, MD;  Location: [facility];  Service: Endoscopy;  Laterality: N/A;    Family History   Problem Relation Age of Onset   Breast cancer Mother 4   Cancer Mother        breast   Cancer Father        prostate   Breast cancer Sister 53   Cancer Sister        breast   Diabetes Sister    Colon cancer Sister    Breast cancer Maternal Aunt    Breast cancer Paternal Aunt    Breast cancer Paternal Grandmother    Cancer Paternal Grandmother        breast   Diabetes Maternal Grandmother    Heart attack Maternal Grandfather    Diabetes Sister    Ovarian cancer Neg Hx     Social History   Socioeconomic History   Marital status: Married    Spouse name: Not on file   Number of children: Not on file   Years of education: Not on file   Highest education level: Not on file  Occupational History   Not on file  Tobacco Use   Smoking status: Never   Smokeless tobacco: Never  Vaping Use   Vaping Use: Never used  Substance and Sexual Activity   Alcohol use: Yes    Alcohol/week: 0.0 standard drinks    Comment: 1  drink/mo   Drug use: No   Sexual activity: Yes    Birth control/protection: None  Other Topics Concern   Not on file  Social History Narrative   Not on file   Social Determinants of Health   Financial Resource Strain: Not on file  Food Insecurity: Not on file  Transportation Needs: Not on file  Physical Activity: Not on file  Stress: Not on file  Social Connections: Not on file  Intimate Partner Violence: Not on file    Current Outpatient Medications on File Prior to Visit  Medication Sig Dispense Refill   Blood Glucose Monitoring Suppl (ONETOUCH VERIO) w/Device KIT Use to check blood sugar twice a day, fasting in morning and 2 hours after a meal.  Goals: < 130 fasting in morning and <180 two hours after your eat. 1 kit 0   calcium citrate-vitamin D (CITRACAL+D) 315-200 MG-UNIT tablet Take 1 tablet by mouth daily.     Cranberry (THERACRAN ONE PO) Take 1 tablet by mouth daily.     cyclobenzaprine (FLEXERIL) 10 MG tablet Take 1 tablet (10 mg total) by  mouth 3 (three) times daily as needed for muscle spasms. 60 tablet 0   estradiol (ESTRACE) 0.1 MG/GM vaginal cream INSERT 1 APPLICATORFUL  VAGINALLY TWICE WEEKLY 127.5 g 3   fluticasone (FLONASE) 50 MCG/ACT nasal spray Place 2 sprays into both nostrils as needed.      glucose blood (ONETOUCH VERIO) test strip Use to check blood sugar twice a day, fasting in morning and 2 hours after a meal.  Goals: < 130 fasting in morning and <180 two hours after your eat. 100 each 12   ibuprofen (ADVIL,MOTRIN) 200 MG tablet Take 200 mg by mouth every 6 (six) hours as needed for headache.     Lancets (ONETOUCH ULTRASOFT) lancets Use to check blood sugar twice a day, fasting in morning and 2 hours after a meal.  Goals: < 130 fasting in morning and <180 two hours after your eat. 100 each 12   Multiple Vitamin (MULTIVITAMIN) tablet Take 1 tablet by mouth daily.     TAMSULOSIN HCL PO      No current facility-administered medications on file prior to visit.    Allergies  Allergen Reactions   Other Nausea And Vomiting    sauerkraut   Zithromax [Azithromycin] Diarrhea        Adhesive [Tape] Rash    Band-aids     Review of Systems Constitutional: negative for chills, fatigue, fevers and sweats Eyes: negative for irritation, redness and visual disturbance Ears, nose, mouth, throat, and face: negative for hearing loss, nasal congestion, snoring and tinnitus Respiratory: negative for asthma, cough, sputum Cardiovascular: negative for chest pain, dyspnea, exertional chest pressure/discomfort, irregular heart beat, palpitations and syncope Gastrointestinal: negative for abdominal pain, change in bowel habits, nausea and vomiting Genitourinary: negative for abnormal menstrual periods, genital lesions, sexual problems and vaginal discharge, dysuria and urinary incontinence Integument/breast: negative for breast lump, breast tenderness and nipple discharge Hematologic/lymphatic: negative for bleeding and easy  bruising Musculoskeletal:negative for back pain and muscle weakness Neurological: negative for dizziness, headaches, vertigo and weakness Endocrine: negative for diabetic symptoms including polydipsia, polyuria and skin dryness Allergic/Immunologic: negative for hay fever and urticaria      Objective:  Blood pressure 104/69, pulse 66, height 5' 6.46" (1.688 m), weight 175 lb 12.8 oz (79.7 kg), last menstrual period 05/15/2020. Body mass index is 27.98 kg/m.   General Appearance:    Alert, cooperative, no distress, appears stated  age  Head:    Normocephalic, without obvious abnormality, atraumatic  Eyes:    PERRL, conjunctiva/corneas clear, EOM's intact, both eyes  Ears:    Normal external ear canals, both ears  Nose:   Nares normal, septum midline, mucosa normal, no drainage or sinus tenderness  Throat:   Lips, mucosa, and tongue normal; teeth and gums normal  Neck:   Supple, symmetrical, trachea midline, no adenopathy; thyroid: no enlargement/tenderness/nodules; no carotid bruit or JVD  Back:     Symmetric, no curvature, ROM normal, no CVA tenderness  Lungs:     Clear to auscultation bilaterally, respirations unlabored  Chest Wall:    No tenderness or deformity   Heart:    Regular rate and rhythm, S1 and S2 normal, no murmur, rub or gallop  Breast Exam:    No tenderness, masses, or nipple abnormality  Abdomen:     Soft, non-tender, bowel sounds active all four quadrants, no masses, no organomegaly.    Genitalia:    Pelvic:external genitalia normal, vagina without lesions, discharge, or tenderness, rectovaginal septum  normal. Cervix normal in appearance, no cervical motion tenderness, no adnexal masses or tenderness.  Uterus normal size, shape, mobile, regular contours, nontender.  Rectal:    Normal external sphincter.  No hemorrhoids appreciated. Internal exam not done.   Extremities:   Extremities normal, atraumatic, no cyanosis or edema  Pulses:   2+ and symmetric all extremities   Skin:   Skin color, texture, turgor normal, no rashes or lesions  Lymph nodes:   Cervical, supraclavicular, and axillary nodes normal  Neurologic:   CNII-XII intact, normal strength, sensation and reflexes throughout   .  Labs:  Lab Results  Component Value Date   WBC 6.3 03/18/2020   HGB 14.0 03/18/2020   HCT 43.1 03/18/2020   MCV 93 03/18/2020   PLT 216 03/18/2020    Lab Results  Component Value Date   CREATININE 0.90 03/18/2020   BUN 12 03/18/2020   NA 142 03/18/2020   K 4.5 03/18/2020   CL 103 03/18/2020   CO2 27 03/18/2020    Lab Results  Component Value Date   ALT 27 03/18/2020   AST 27 03/18/2020   ALKPHOS 77 03/18/2020   BILITOT 0.5 03/18/2020    Lab Results  Component Value Date   TSH 2.130 03/18/2020     Assessment:   1. Encounter for well woman exam with routine gynecological exam   2. Breast cancer screening by mammogram   3. Family history of colon cancer   4. Abnormal uterine bleeding    Plan:  - Blood tests: FSH/LH Had other health maintenance labs done by PCP. - Breast self exam technique reviewed and patient encouraged to perform self-exam monthly. - Mammogram discussed, pt has appointment scheduled. - Continue routine screening for colon cancer. Currently up to date.  - Contraception: none. Likely postmenopausal. Will assess.  - Discussed healthy lifestyle modifications. - Pap smear up to date. Due in 1 year.  - COVID vaccination status: completed series and first booster.  - Hold on Estrace cream for atrophy at this time due to bleeding. Can continue to use dime-size amount at urethra due to h/o recurrent UTI's.  - Follow up in 1 year for annual exam.   Rubie Maid, MD  [facility]

## 2020-12-01 ENCOUNTER — Other Ambulatory Visit: Payer: Self-pay

## 2020-12-01 ENCOUNTER — Ambulatory Visit (INDEPENDENT_AMBULATORY_CARE_PROVIDER_SITE_OTHER): Payer: 59 | Admitting: Obstetrics and Gynecology

## 2020-12-01 ENCOUNTER — Encounter: Payer: Self-pay | Admitting: Obstetrics and Gynecology

## 2020-12-01 VITALS — BP 104/69 | HR 66 | Ht 66.46 in | Wt 175.8 lb

## 2020-12-01 DIAGNOSIS — N939 Abnormal uterine and vaginal bleeding, unspecified: Secondary | ICD-10-CM

## 2020-12-01 DIAGNOSIS — Z01419 Encounter for gynecological examination (general) (routine) without abnormal findings: Secondary | ICD-10-CM

## 2020-12-01 DIAGNOSIS — Z800 Family history of malignant neoplasm of digestive organs: Secondary | ICD-10-CM | POA: Diagnosis not present

## 2020-12-01 DIAGNOSIS — Z1231 Encounter for screening mammogram for malignant neoplasm of breast: Secondary | ICD-10-CM

## 2020-12-02 LAB — FSH/LH
FSH: 79.3 m[IU]/mL
LH: 51.3 m[IU]/mL

## 2021-01-07 ENCOUNTER — Ambulatory Visit
Admission: RE | Admit: 2021-01-07 | Discharge: 2021-01-07 | Disposition: A | Discharge status: Home / Self Care | Payer: 59 | Source: Ambulatory Visit | Attending: Obstetrics and Gynecology | Admitting: Obstetrics and Gynecology

## 2021-01-07 ENCOUNTER — Other Ambulatory Visit: Payer: Self-pay

## 2021-01-07 DIAGNOSIS — Z1231 Encounter for screening mammogram for malignant neoplasm of breast: Secondary | ICD-10-CM | POA: Insufficient documentation

## 2021-01-25 ENCOUNTER — Other Ambulatory Visit: Payer: Self-pay

## 2021-01-25 ENCOUNTER — Ambulatory Visit
Admission: RE | Admit: 2021-01-25 | Discharge: 2021-01-25 | Disposition: A | Discharge status: Home / Self Care | Payer: 59 | Source: Ambulatory Visit | Attending: Nurse Practitioner | Admitting: Nurse Practitioner

## 2021-01-25 ENCOUNTER — Ambulatory Visit: Payer: 59 | Admitting: Nurse Practitioner

## 2021-01-25 ENCOUNTER — Encounter: Payer: Self-pay | Admitting: Nurse Practitioner

## 2021-01-25 ENCOUNTER — Ambulatory Visit
Admission: RE | Admit: 2021-01-25 | Discharge: 2021-01-25 | Disposition: A | Discharge status: Home / Self Care | Payer: 59 | Attending: Nurse Practitioner | Admitting: Nurse Practitioner

## 2021-01-25 VITALS — BP 139/79 | HR 63 | Temp 98.4°F | Wt 175.4 lb

## 2021-01-25 DIAGNOSIS — M542 Cervicalgia: Secondary | ICD-10-CM | POA: Diagnosis present

## 2021-01-25 MED ORDER — GABAPENTIN 100 MG PO CAPS: 100.0000 mg | ORAL_CAPSULE | Freq: Every evening | ORAL | 0 refills | Status: DC | PRN

## 2021-01-25 NOTE — Progress Notes (Signed)
Acute Office Visit  Subjective:    Patient ID: Sydney Horne, female    DOB: 1965/07/26, 55 y.o.   MRN: [id number]  Chief Complaint  Patient presents with   Neck Pain    Pt states she has had a pinched nerve in her neck for the last 2 to 3 weeks.     HPI Patient is in today for neck pain that radiates down her left arm to her left thumb that started about 2 weeks ago. She states she was at gym and thinks that she hurt her neck during a plank exercise that seemed harder than normal. She has been taking it easy since then, using ice, advil, and heat rubs. She has also been getting adjusted at the chiropractor. The pain is worse at night and worse with certain movements like turning her head. The pain is worse at night. The pain has gotten better over the last few weeks and is only mild. She is concerned that she is still having numbness down her arm, especially in her thumb and is asking about starting to increase exercise or stretching again. She denies dizziness, weakness, bruising, and edema.   Past Medical History:  Diagnosis Date   Environmental allergies    Headache    2x/mo   Heart murmur    followed by PCP   Kidney stones    Neck pain    sees chiropractor for adjustment when having problems   Vertigo 2017   2 episodes    Past Surgical History:  Procedure Laterality Date   bladder control surgery     TVT, performed in 2015   COLONOSCOPY WITH PROPOFOL N/A 11/27/2016   Procedure: COLONOSCOPY WITH PROPOFOL;  Surgeon: Lucilla Lame, MD;  Location: [facility];  Service: Endoscopy;  Laterality: N/A;    Family History  Problem Relation Age of Onset   Breast cancer Mother 21   Cancer Mother        breast   Cancer Father        prostate   Breast cancer Sister 75   Cancer Sister        breast   Diabetes Sister    Colon cancer Sister    Breast cancer Maternal Aunt    Breast cancer Paternal Aunt    Breast cancer Paternal Grandmother    Cancer Paternal Grandmother         breast   Diabetes Maternal Grandmother    Heart attack Maternal Grandfather    Diabetes Sister    Ovarian cancer Neg Hx     Social History   Socioeconomic History   Marital status: Married    Spouse name: Not on file   Number of children: Not on file   Years of education: Not on file   Highest education level: Not on file  Occupational History   Not on file  Tobacco Use   Smoking status: Never   Smokeless tobacco: Never  Vaping Use   Vaping Use: Never used  Substance and Sexual Activity   Alcohol use: Not Currently    Comment: 1 drink/mo   Drug use: No   Sexual activity: Yes    Birth control/protection: None  Other Topics Concern   Not on file  Social History Narrative   Not on file   Social Determinants of Health   Financial Resource Strain: Not on file  Food Insecurity: Not on file  Transportation Needs: Not on file  Physical Activity: Not on file  Stress:  Not on file  Social Connections: Not on file  Intimate Partner Violence: Not on file    Outpatient Medications Prior to Visit  Medication Sig Dispense Refill   Blood Glucose Monitoring Suppl (ONETOUCH VERIO) w/Device KIT Use to check blood sugar twice a day, fasting in morning and 2 hours after a meal.  Goals: < 130 fasting in morning and <180 two hours after your eat. 1 kit 0   calcium citrate-vitamin D (CITRACAL+D) 315-200 MG-UNIT tablet Take 1 tablet by mouth daily.     Cranberry (THERACRAN ONE PO) Take 1 tablet by mouth daily.     cyclobenzaprine (FLEXERIL) 10 MG tablet Take 1 tablet (10 mg total) by mouth 3 (three) times daily as needed for muscle spasms. 60 tablet 0   glucose blood (ONETOUCH VERIO) test strip Use to check blood sugar twice a day, fasting in morning and 2 hours after a meal.  Goals: < 130 fasting in morning and <180 two hours after your eat. 100 each 12   ibuprofen (ADVIL,MOTRIN) 200 MG tablet Take 200 mg by mouth every 6 (six) hours as needed for headache.     Lancets (ONETOUCH  ULTRASOFT) lancets Use to check blood sugar twice a day, fasting in morning and 2 hours after a meal.  Goals: < 130 fasting in morning and <180 two hours after your eat. 100 each 12   Multiple Vitamin (MULTIVITAMIN) tablet Take 1 tablet by mouth daily.     TAMSULOSIN HCL PO      No facility-administered medications prior to visit.    Allergies  Allergen Reactions   Other Nausea And Vomiting    sauerkraut   Zithromax [Azithromycin] Diarrhea        Adhesive [Tape] Rash    Band-aids    Review of Systems  Constitutional: Negative.   Respiratory: Negative.    Cardiovascular: Negative.   Gastrointestinal: Negative.   Genitourinary: Negative.   Musculoskeletal:  Positive for neck pain.  Skin: Negative.   Neurological:  Positive for numbness (left thumb).      Objective:    Physical Exam Vitals and nursing note reviewed.  Constitutional:      General: She is not in acute distress.    Appearance: Normal appearance.  HENT:     Head: Normocephalic.  Eyes:     Conjunctiva/sclera: Conjunctivae normal.  Cardiovascular:     Rate and Rhythm: Normal rate and regular rhythm.     Pulses: Normal pulses.     Heart sounds: Normal heart sounds.  Pulmonary:     Effort: Pulmonary effort is normal.     Breath sounds: Normal breath sounds.  Musculoskeletal:        General: Tenderness present. No swelling.     Cervical back: Normal range of motion. Tenderness present.     Comments: Limited ROM to neck due to pain.   Skin:    General: Skin is warm.     Findings: No bruising or erythema.  Neurological:     General: No focal deficit present.     Mental Status: She is alert and oriented to person, place, and time.     Comments: She still can tell when something is touching her thumb, however the sensation is different  Psychiatric:        Mood and Affect: Mood normal.        Behavior: Behavior normal.        Thought Content: Thought content normal.        Judgment: Judgment  normal.     BP 139/79   Pulse 63   Temp 98.4 F (36.9 C) (Oral)   Wt 175 lb 6.4 oz (79.6 kg)   SpO2 97%   BMI 27.92 kg/m  Wt Readings from Last 3 Encounters:  01/25/21 175 lb 6.4 oz (79.6 kg)  12/01/20 175 lb 12.8 oz (79.7 kg)  03/18/20 189 lb (85.7 kg)    Health Maintenance Due  Topic Date Due   Zoster Vaccines- Shingrix (1 of 2) Never done   COVID-19 Vaccine (4 - Booster for Pfizer series) 08/18/2020    There are no preventive care reminders to display for this patient.   Lab Results  Component Value Date   TSH 2.130 03/18/2020   Lab Results  Component Value Date   WBC 6.3 03/18/2020   HGB 14.0 03/18/2020   HCT 43.1 03/18/2020   MCV 93 03/18/2020   PLT 216 03/18/2020   Lab Results  Component Value Date   NA 142 03/18/2020   K 4.5 03/18/2020   CO2 27 03/18/2020   GLUCOSE 94 03/18/2020   BUN 12 03/18/2020   CREATININE 0.90 03/18/2020   BILITOT 0.5 03/18/2020   ALKPHOS 77 03/18/2020   AST 27 03/18/2020   ALT 27 03/18/2020   PROT 6.6 03/18/2020   ALBUMIN 4.5 03/18/2020   CALCIUM 9.6 03/18/2020   Lab Results  Component Value Date   CHOL 163 03/18/2020   Lab Results  Component Value Date   HDL 48 03/18/2020   Lab Results  Component Value Date   LDLCALC 89 03/18/2020   Lab Results  Component Value Date   TRIG 149 03/18/2020   Lab Results  Component Value Date   CHOLHDL 2.7 01/18/2018   Lab Results  Component Value Date   HGBA1C 5.9 (H) 03/18/2020       Assessment & Plan:   Problem List Items Addressed This Visit       Other   Neck pain - Primary    Neck pain with radiculopathy down left arm. No red flags on exam. Will check x-ray of cervical spine. Continue ibuprofen, ice/heat, and going to chiropractor. Discussed gentle stretches and printed exercises that she can do daily at home. Try these exercises for 1 week before going back to the gym and taking it slowly. Will also treat with gabapentin 100-237m qHS prn to help with pain at night.  Follow up in 4 weeks.       Relevant Orders   DG Cervical Spine Complete     Meds ordered this encounter  Medications   gabapentin (NEURONTIN) 100 MG capsule    Sig: Take 1-2 capsules (100-200 mg total) by mouth at bedtime as needed.    Dispense:  30 capsule    Refill:  0     LCharyl Dancer NP

## 2021-01-26 DIAGNOSIS — M542 Cervicalgia: Secondary | ICD-10-CM | POA: Insufficient documentation

## 2021-01-26 NOTE — Assessment & Plan Note (Signed)
Neck pain with radiculopathy down left arm. No red flags on exam. Will check x-ray of cervical spine. Continue ibuprofen, ice/heat, and going to chiropractor. Discussed gentle stretches and printed exercises that she can do daily at home. Try these exercises for 1 week before going back to the gym and taking it slowly. Will also treat with gabapentin 100-'200mg'$  qHS prn to help with pain at night. Follow up in 4 weeks.

## 2021-01-27 ENCOUNTER — Telehealth: Payer: Self-pay

## 2021-01-27 NOTE — Telephone Encounter (Signed)
Copied from [place] [id number]. Topic: General - Other >> Jan 27, 2021  8:29 AM Leward Quan A wrote: Reason for CRM: Patient called in waiting on a call from [facility] to discuss findings from her Xray on 01/25/21. Please call Ph# [phone]   Called patient. Confirmed that result was reviewed on mychart.

## 2021-02-23 ENCOUNTER — Ambulatory Visit: Payer: 59 | Admitting: Nurse Practitioner

## 2021-03-14 ENCOUNTER — Other Ambulatory Visit: Payer: Self-pay

## 2021-03-14 ENCOUNTER — Encounter: Payer: Self-pay | Admitting: Nurse Practitioner

## 2021-03-14 ENCOUNTER — Ambulatory Visit (INDEPENDENT_AMBULATORY_CARE_PROVIDER_SITE_OTHER): Payer: 59 | Admitting: Nurse Practitioner

## 2021-03-14 VITALS — BP 121/74 | HR 62 | Temp 97.8°F | Ht 65.8 in | Wt 169.6 lb

## 2021-03-14 DIAGNOSIS — Z23 Encounter for immunization: Secondary | ICD-10-CM | POA: Diagnosis not present

## 2021-03-14 DIAGNOSIS — R8281 Pyuria: Secondary | ICD-10-CM

## 2021-03-14 DIAGNOSIS — R7303 Prediabetes: Secondary | ICD-10-CM | POA: Diagnosis not present

## 2021-03-14 DIAGNOSIS — G43009 Migraine without aura, not intractable, without status migrainosus: Secondary | ICD-10-CM

## 2021-03-14 DIAGNOSIS — Z87440 Personal history of urinary (tract) infections: Secondary | ICD-10-CM | POA: Diagnosis not present

## 2021-03-14 DIAGNOSIS — E559 Vitamin D deficiency, unspecified: Secondary | ICD-10-CM | POA: Diagnosis not present

## 2021-03-14 DIAGNOSIS — Z803 Family history of malignant neoplasm of breast: Secondary | ICD-10-CM

## 2021-03-14 DIAGNOSIS — Z0000 Encounter for general adult medical examination without abnormal findings: Secondary | ICD-10-CM

## 2021-03-14 DIAGNOSIS — E663 Overweight: Secondary | ICD-10-CM

## 2021-03-14 DIAGNOSIS — R300 Dysuria: Secondary | ICD-10-CM

## 2021-03-14 DIAGNOSIS — Z136 Encounter for screening for cardiovascular disorders: Secondary | ICD-10-CM

## 2021-03-14 LAB — URINALYSIS, ROUTINE W REFLEX MICROSCOPIC
Bilirubin, UA: NEGATIVE
Glucose, UA: NEGATIVE
Ketones, UA: NEGATIVE
Nitrite, UA: NEGATIVE
Protein,UA: NEGATIVE
RBC, UA: NEGATIVE
Specific Gravity, UA: 1.015 (ref 1.005–1.030)
Urobilinogen, Ur: 0.2 mg/dL (ref 0.2–1.0)
pH, UA: 6 (ref 5.0–7.5)

## 2021-03-14 LAB — WET PREP FOR TRICH, YEAST, CLUE
Clue Cell Exam: NEGATIVE
Trichomonas Exam: NEGATIVE
Yeast Exam: NEGATIVE

## 2021-03-14 LAB — MICROSCOPIC EXAMINATION
Epithelial Cells (non renal): NONE SEEN /hpf (ref 0–10)
RBC, Urine: NONE SEEN /hpf (ref 0–2)

## 2021-03-14 LAB — MICROALBUMIN, URINE WAIVED
Creatinine, Urine Waived: 100 mg/dL (ref 10–300)
Microalb, Ur Waived: 10 mg/L (ref 0–19)
Microalb/Creat Ratio: <30 mg/g (ref <30)

## 2021-03-14 LAB — BAYER DCA HB A1C WAIVED: HB A1C (BAYER DCA - WAIVED): 5.4 % (ref 4.8–5.6)

## 2021-03-14 MED ORDER — MELOXICAM 15 MG PO TABS: 15.0000 mg | ORAL_TABLET | Freq: Every day | ORAL | 0 refills | Status: DC | PRN

## 2021-03-14 NOTE — Patient Instructions (Signed)

## 2021-03-14 NOTE — Assessment & Plan Note (Signed)
UA today performed -- reassuring, but will send for culture for further assessment due to history and recent symptoms.

## 2021-03-14 NOTE — Assessment & Plan Note (Signed)
No current medications, monitor closely.  Labs today. 

## 2021-03-14 NOTE — Progress Notes (Signed)
BP 121/74   Pulse 62   Temp 97.8 F (36.6 C) (Oral)   Ht 5' 5.8" (1.671 m)   Wt 169 lb 9.6 oz (76.9 kg)   SpO2 98%   BMI 27.54 kg/m    Subjective:    Patient ID: Sydney Horne, female    DOB: 17-Jul-1965, 55 y.o.   MRN: [id number]  HPI: Sydney Horne is a 55 y.o. female presenting on 03/14/2021 for comprehensive medical examination. Current medical complaints include:none  She currently lives with: husband Menopausal Symptoms: no  Last visit in November 2021 was A1c 5.9%.  She is checking sugars at home fasting in morning -- range in 100.  Has two sisters who are diabetic.    URINARY SYMPTOMS History of frequent UTI she had to take Macrobid daily for two years with.  Had some UTI symptoms yesterday. Dysuria: burning yesterday Urinary frequency: yes Urgency: yes Small volume voids: yes Symptom severity: yes Urinary incontinence: no Foul odor: no Hematuria: no Abdominal pain: no Back pain: no Suprapubic pain/pressure: no Flank pain: no Fever:  no Vomiting: no Relief with cranberry juice: yes Status: stable Previous urinary tract infection: yes Recurrent urinary tract infection:  history of Sexual activity: monogamous History of sexually transmitted disease: no Treatments attempted: cranberry and increasing fluids    Depression Screen done today and results listed below:  Depression screen [facility] 2/9 03/14/2021 03/18/2020 01/22/2019 01/18/2018 01/12/2017  Decreased Interest 0 0 0 0 0  Down, Depressed, Hopeless 0 0 0 0 1  PHQ - 2 Score 0 0 0 0 1  Altered sleeping 3 - 1 0 2  Tired, decreased energy 0 - 0 0 0  Change in appetite 0 - 1 0 0  Feeling bad or failure about yourself  0 - 0 0 0  Trouble concentrating 0 - 0 0 1  Moving slowly or fidgety/restless 0 - 0 0 0  Suicidal thoughts 0 - 0 0 0  PHQ-9 Score 3 - 2 0 4  Difficult doing work/chores Not difficult at all - Not difficult at all - -    Fall Risk 01/18/2018 11/04/2018 01/22/2019 03/14/2021 03/14/2021  Falls in the  past year? No 0 0 0 0  Was there an injury with Fall? - - 0 0 0  Fall Risk Category Calculator - - 0 0 0  Fall Risk Category - - Low Low Low  Patient Fall Risk Level - Low fall risk Low fall risk Low fall risk Low fall risk  Patient at Risk for Falls Due to - - - No Fall Risks No Fall Risks  Fall risk Follow up - Falls evaluation completed Education provided;Falls evaluation completed Falls evaluation completed Falls prevention discussed    Functional Status Survey: Is the patient deaf or have difficulty hearing?: No Does the patient have difficulty seeing, even when wearing glasses/contacts?: No Does the patient have difficulty concentrating, remembering, or making decisions?: No Does the patient have difficulty walking or climbing stairs?: No Does the patient have difficulty dressing or bathing?: No Does the patient have difficulty doing errands alone such as visiting a doctor's office or shopping?: No   Past Medical History:  Past Medical History:  Diagnosis Date   Environmental allergies    Headache    2x/mo   Heart murmur    followed by PCP   Kidney stones    Neck pain    sees chiropractor for adjustment when having problems   Vertigo 2017   2 episodes  Surgical History:  Past Surgical History:  Procedure Laterality Date   bladder control surgery     TVT, performed in 2015   COLONOSCOPY WITH PROPOFOL N/A 11/27/2016   Procedure: COLONOSCOPY WITH PROPOFOL;  Surgeon: Lucilla Lame, MD;  Location: [facility];  Service: Endoscopy;  Laterality: N/A;    Medications:  Current Outpatient Medications on File Prior to Visit  Medication Sig   Blood Glucose Monitoring Suppl (ONETOUCH VERIO) w/Device KIT Use to check blood sugar twice a day, fasting in morning and 2 hours after a meal.  Goals: < 130 fasting in morning and <180 two hours after your eat.   calcium citrate-vitamin D (CITRACAL+D) 315-200 MG-UNIT tablet Take 1 tablet by mouth daily.   Cranberry (THERACRAN ONE  PO) Take 1 tablet by mouth daily.   cyclobenzaprine (FLEXERIL) 10 MG tablet Take 1 tablet (10 mg total) by mouth 3 (three) times daily as needed for muscle spasms.   gabapentin (NEURONTIN) 100 MG capsule Take 1-2 capsules (100-200 mg total) by mouth at bedtime as needed.   glucose blood (ONETOUCH VERIO) test strip Use to check blood sugar twice a day, fasting in morning and 2 hours after a meal.  Goals: < 130 fasting in morning and <180 two hours after your eat.   ibuprofen (ADVIL,MOTRIN) 200 MG tablet Take 200 mg by mouth every 6 (six) hours as needed for headache.   Lancets (ONETOUCH ULTRASOFT) lancets Use to check blood sugar twice a day, fasting in morning and 2 hours after a meal.  Goals: < 130 fasting in morning and <180 two hours after your eat.   Multiple Vitamin (MULTIVITAMIN) tablet Take 1 tablet by mouth daily.   No current facility-administered medications on file prior to visit.    Allergies:  Allergies  Allergen Reactions   Other Nausea And Vomiting    sauerkraut   Zithromax [Azithromycin] Diarrhea        Adhesive [Tape] Rash    Band-aids    Social History:  Social History   Socioeconomic History   Marital status: Married    Spouse name: Not on file   Number of children: Not on file   Years of education: Not on file   Highest education level: Not on file  Occupational History   Not on file  Tobacco Use   Smoking status: Never   Smokeless tobacco: Never  Vaping Use   Vaping Use: Never used  Substance and Sexual Activity   Alcohol use: Not Currently    Comment: 1 drink/mo   Drug use: No   Sexual activity: Yes    Birth control/protection: None  Other Topics Concern   Not on file  Social History Narrative   Not on file   Social Determinants of Health   Financial Resource Strain: Low Risk    Difficulty of Paying Living Expenses: Not hard at all  Food Insecurity: No Food Insecurity   Worried About Charity fundraiser in the Last Year: Never true   Ran  Out of Food in the Last Year: Never true  Transportation Needs: No Transportation Needs   Lack of Transportation (Medical): No   Lack of Transportation (Non-Medical): No  Physical Activity: Sufficiently Active   Days of Exercise per Week: 4 days   Minutes of Exercise per Session: 50 min  Stress: No Stress Concern Present   Feeling of Stress : Only a little  Social Connections: Moderately Integrated   Frequency of Communication with Friends and Family: More than three  times a week   Frequency of Social Gatherings with Friends and Family: More than three times a week   Attends Religious Services: 1 to 4 times per year   Active Member of [employer] or Organizations: No   Attends Music therapist: Never   Marital Status: Married  Human resources officer Violence: Not At Risk   Fear of Current or Ex-Partner: No   Emotionally Abused: No   Physically Abused: No   Sexually Abused: No   Social History   Tobacco Use  Smoking Status Never  Smokeless Tobacco Never   Social History   Substance and Sexual Activity  Alcohol Use Not Currently   Comment: 1 drink/mo    Family History:  Family History  Problem Relation Age of Onset   Breast cancer Mother 49   Cancer Mother        breast   Cancer Father        prostate   Breast cancer Sister 82   Cancer Sister        breast   Diabetes Sister    Colon cancer Sister    Breast cancer Maternal Aunt    Breast cancer Paternal Aunt    Breast cancer Paternal Grandmother    Cancer Paternal Grandmother        breast   Diabetes Maternal Grandmother    Heart attack Maternal Grandfather    Diabetes Sister    Ovarian cancer Neg Hx     Past medical history, surgical history, medications, allergies, family history and social history reviewed with patient today and changes made to appropriate areas of the chart.   Review of Systems - negative All other ROS negative except what is listed above and in the HPI.      Objective:    BP  121/74   Pulse 62   Temp 97.8 F (36.6 C) (Oral)   Ht 5' 5.8" (1.671 m)   Wt 169 lb 9.6 oz (76.9 kg)   SpO2 98%   BMI 27.54 kg/m   Wt Readings from Last 3 Encounters:  03/14/21 169 lb 9.6 oz (76.9 kg)  01/25/21 175 lb 6.4 oz (79.6 kg)  12/01/20 175 lb 12.8 oz (79.7 kg)    Physical Exam Vitals and nursing note reviewed.  Constitutional:      General: She is awake. She is not in acute distress.    Appearance: She is well-developed, well-groomed and overweight. She is not ill-appearing.  HENT:     Head: Normocephalic.     Right Ear: Hearing, tympanic membrane, ear canal and external ear normal. No drainage.     Left Ear: Hearing, tympanic membrane, ear canal and external ear normal. No drainage.     Nose: Nose normal.     Mouth/Throat:     Mouth: Mucous membranes are moist.     Pharynx: Uvula midline.  Eyes:     General: Lids are normal.        Right eye: No discharge.        Left eye: No discharge.     Extraocular Movements: Extraocular movements intact.     Conjunctiva/sclera: Conjunctivae normal.     Pupils: Pupils are equal, round, and reactive to light.     Visual Fields: Right eye visual fields normal and left eye visual fields normal.  Neck:     Thyroid: No thyromegaly.     Vascular: No carotid bruit.  Cardiovascular:     Rate and Rhythm: Normal rate and regular rhythm.  Heart sounds: Normal heart sounds. No murmur heard.   No gallop.  Pulmonary:     Effort: Pulmonary effort is normal. No accessory muscle usage or respiratory distress.     Breath sounds: Normal breath sounds.  Chest:  Breasts:    Right: Normal.     Left: Normal.  Abdominal:     General: Bowel sounds are normal.     Palpations: Abdomen is soft. There is no hepatomegaly or splenomegaly.     Tenderness: There is no abdominal tenderness.  Musculoskeletal:     Cervical back: Normal range of motion and neck supple.     Right lower leg: No edema.     Left lower leg: No edema.  Lymphadenopathy:      Head:     Right side of head: No submental, submandibular, tonsillar, preauricular or posterior auricular adenopathy.     Left side of head: No submental, submandibular, tonsillar, preauricular or posterior auricular adenopathy.     Cervical: No cervical adenopathy.     Upper Body:     Right upper body: No supraclavicular, axillary or pectoral adenopathy.     Left upper body: No supraclavicular, axillary or pectoral adenopathy.  Skin:    General: Skin is warm and dry.  Neurological:     Mental Status: She is alert and oriented to person, place, and time.     Gait: Gait is intact.     Deep Tendon Reflexes: Reflexes are normal and symmetric.     Reflex Scores:      Brachioradialis reflexes are 2+ on the right side and 2+ on the left side.      Patellar reflexes are 2+ on the right side and 2+ on the left side. Psychiatric:        Attention and Perception: Attention normal.        Mood and Affect: Mood normal.        Speech: Speech normal.        Behavior: Behavior normal. Behavior is cooperative.        Thought Content: Thought content normal.        Judgment: Judgment normal.   Results for orders placed or performed in visit on 12/01/20  FSH/LH  Result Value Ref Range   LH 51.3 mIU/mL   FSH 79.3 mIU/mL      Assessment & Plan:   Problem List Items Addressed This Visit       Cardiovascular and Mediastinum   Migraines    No current medications, monitor closely.  Labs today.      Relevant Medications   meloxicam (MOBIC) 15 MG tablet   Other Relevant Orders   CBC with Differential/Platelet   TSH     Other   Family history of breast cancer in first degree relative    Continue yearly mammograms -- last in August 2022.  She has not had genetic testing, reports whole family has tested negative.      History of recurrent UTI (urinary tract infection)    UA today performed -- reassuring, but will send for culture for further assessment due to history and recent  symptoms.      Overweight    Has lost weight.  Recommended eating smaller high protein, low fat meals more frequently and exercising 30 mins a day 5 times a week with a goal of 10-15lb weight loss in the next 3 months. Patient voiced their understanding and motivation to adhere to these recommendations.       Prediabetes -  Primary    A1c today 5.4% -- improved from previous.  Recommend continue diet and exercise focus.        Relevant Orders   Bayer DCA Hb A1c Waived   Microalbumin, Urine Waived   Comprehensive metabolic panel   Other Visit Diagnoses     Vitamin D deficiency       History of low levels, check today and start Vitamin D supplement as needed.   Relevant Orders   VITAMIN D 25 Hydroxy (Vit-D Deficiency, Fractures)   Dysuria       Refer to recurrent UTI plan of care.   Relevant Orders   Urinalysis, Routine w reflex microscopic   WET PREP FOR TRICH, YEAST, CLUE   Pyuria       Send urine for culture.   Relevant Orders   Urine Culture   Encounter for screening for cardiovascular disorders       Lipid panel today.   Relevant Orders   Lipid Panel w/o Chol/HDL Ratio   Need for influenza vaccination       Flu vaccination today.   Relevant Orders   Flu Vaccine QUAD 6+ mos PF IM (Fluarix Quad PF) (Completed)   Need for shingles vaccine       Shingles vaccine today   Relevant Orders   Varicella-zoster vaccine IM (Shingrix) (Completed)   Encounter for annual physical exam       Annual labs today and health maintenance reviewed today.   Relevant Orders   CBC with Differential/Platelet   TSH        Follow up plan: Return in about 1 year (around 03/14/2022) for Annual physical.   LABORATORY TESTING:  - Pap smear: up to date  IMMUNIZATIONS:   - Tdap: Tetanus vaccination status reviewed: last tetanus booster within 10 years. - Influenza: Administered today - Pneumovax: Not applicable - Prevnar: Not applicable - HPV: Not applicable - Zostavax vaccine: first  shot given today  SCREENING: -Mammogram: Up to date  - Colonoscopy: Up to date  - Bone Density: Not applicable  -Hearing Test: Not applicable  -Spirometry: Not applicable   PATIENT COUNSELING:   Advised to take 1 mg of folate supplement per day if capable of pregnancy.   Sexuality: Discussed sexually transmitted diseases, partner selection, use of condoms, avoidance of unintended pregnancy  and contraceptive alternatives.   Advised to avoid cigarette smoking.  I discussed with the patient that most people either abstain from alcohol or drink within safe limits (<=14/week and <=4 drinks/occasion for males, <=7/weeks and <= 3 drinks/occasion for females) and that the risk for alcohol disorders and other health effects rises proportionally with the number of drinks per week and how often a drinker exceeds daily limits.  Discussed cessation/primary prevention of drug use and availability of treatment for abuse.   Diet: Encouraged to adjust caloric intake to maintain  or achieve ideal body weight, to reduce intake of dietary saturated fat and total fat, to limit sodium intake by avoiding high sodium foods and not adding table salt, and to maintain adequate dietary potassium and calcium preferably from fresh fruits, vegetables, and low-fat dairy products.    Stressed the importance of regular exercise  Injury prevention: Discussed safety belts, safety helmets, smoke detector, smoking near bedding or upholstery.   Dental health: Discussed importance of regular tooth brushing, flossing, and dental visits.    NEXT PREVENTATIVE PHYSICAL DUE IN 1 YEAR. Return in about 1 year (around 03/14/2022) for Annual physical.

## 2021-03-14 NOTE — Assessment & Plan Note (Addendum)
Has lost weight.  Recommended eating smaller high protein, low fat meals more frequently and exercising 30 mins a day 5 times a week with a goal of 10-15lb weight loss in the next 3 months. Patient voiced their understanding and motivation to adhere to these recommendations.

## 2021-03-14 NOTE — Assessment & Plan Note (Signed)
Continue yearly mammograms -- last in August 2022.  She has not had genetic testing, reports whole family has tested negative.

## 2021-03-14 NOTE — Assessment & Plan Note (Signed)
A1c today 5.4% -- improved from previous.  Recommend continue diet and exercise focus.

## 2021-03-15 ENCOUNTER — Encounter: Payer: Self-pay | Admitting: Nurse Practitioner

## 2021-03-15 DIAGNOSIS — E559 Vitamin D deficiency, unspecified: Secondary | ICD-10-CM | POA: Insufficient documentation

## 2021-03-15 LAB — COMPREHENSIVE METABOLIC PANEL
ALT: 19 IU/L (ref 0–32)
AST: 20 IU/L (ref 0–40)
Albumin/Globulin Ratio: 2.2 (ref 1.2–2.2)
Albumin: 4.6 g/dL (ref 3.8–4.9)
Alkaline Phosphatase: 99 IU/L (ref 44–121)
BUN/Creatinine Ratio: 18 (ref 9–23)
BUN: 17 mg/dL (ref 6–24)
Bilirubin Total: 0.4 mg/dL (ref 0.0–1.2)
CO2: 25 mmol/L (ref 20–29)
Calcium: 10.3 mg/dL — ABNORMAL HIGH (ref 8.7–10.2)
Chloride: 102 mmol/L (ref 96–106)
Creatinine, Ser: 0.96 mg/dL (ref 0.57–1.00)
Globulin, Total: 2.1 g/dL (ref 1.5–4.5)
Glucose: 85 mg/dL (ref 70–99)
Potassium: 4.1 mmol/L (ref 3.5–5.2)
Sodium: 139 mmol/L (ref 134–144)
Total Protein: 6.7 g/dL (ref 6.0–8.5)
eGFR: 69 mL/min/{1.73_m2} (ref >59)

## 2021-03-15 LAB — CBC WITH DIFFERENTIAL/PLATELET
Basophils Absolute: 0.1 10*3/uL (ref 0.0–0.2)
Basos: 1 %
EOS (ABSOLUTE): 0 10*3/uL (ref 0.0–0.4)
Eos: 0 %
Hematocrit: 42.8 % (ref 34.0–46.6)
Hemoglobin: 14.1 g/dL (ref 11.1–15.9)
Immature Grans (Abs): 0 10*3/uL (ref 0.0–0.1)
Immature Granulocytes: 0 %
Lymphocytes Absolute: 1.8 10*3/uL (ref 0.7–3.1)
Lymphs: 19 %
MCH: 30.5 pg (ref 26.6–33.0)
MCHC: 32.9 g/dL (ref 31.5–35.7)
MCV: 92 fL (ref 79–97)
Monocytes Absolute: 0.7 10*3/uL (ref 0.1–0.9)
Monocytes: 7 %
Neutrophils Absolute: 6.7 10*3/uL (ref 1.4–7.0)
Neutrophils: 73 %
Platelets: 198 10*3/uL (ref 150–450)
RBC: 4.63 x10E6/uL (ref 3.77–5.28)
RDW: 13.9 % (ref 11.7–15.4)
WBC: 9.3 10*3/uL (ref 3.4–10.8)

## 2021-03-15 LAB — VITAMIN D 25 HYDROXY (VIT D DEFICIENCY, FRACTURES): Vit D, 25-Hydroxy: 21.7 ng/mL — ABNORMAL LOW (ref 30.0–100.0)

## 2021-03-15 LAB — LIPID PANEL W/O CHOL/HDL RATIO
Cholesterol, Total: 175 mg/dL (ref 100–199)
HDL: 59 mg/dL (ref >39)
LDL Chol Calc (NIH): 92 mg/dL (ref 0–99)
Triglycerides: 139 mg/dL (ref 0–149)
VLDL Cholesterol Cal: 24 mg/dL (ref 5–40)

## 2021-03-15 LAB — TSH: TSH: 2.27 u[IU]/mL (ref 0.450–4.500)

## 2021-03-15 NOTE — Progress Notes (Signed)
Contacted via MyChart   Good morning Sydney Horne, your labs have returned.  Overall they are stable.  Calcium mildly elevated, I would change calcium supplement to every other day.  Vitamin D on low side, I recommend you start taking Vitamin D3 2000 units daily which is good for bone and muscle heath in women.  Any questions on these? Keep being amazing!!  Thank you for allowing me to participate in your care.  I appreciate you. Kindest regards, Ferris Tally

## 2021-03-19 LAB — URINE CULTURE

## 2021-03-19 NOTE — Progress Notes (Signed)
Contacted via MyChart   Good morning Sydney Horne, urine culture returned showing no growth.  Great news!!

## 2021-03-22 ENCOUNTER — Encounter: Payer: 59 | Admitting: Nurse Practitioner

## 2021-04-16 ENCOUNTER — Encounter: Payer: Self-pay | Admitting: Nurse Practitioner

## 2021-04-18 MED ORDER — ONETOUCH VERIO VI STRP: ORAL_STRIP | 12 refills | Status: DC

## 2021-04-18 MED ORDER — ONETOUCH ULTRASOFT LANCETS MISC: 12 refills | Status: AC

## 2021-04-26 ENCOUNTER — Other Ambulatory Visit: Payer: Self-pay | Admitting: Nurse Practitioner

## 2021-04-26 MED ORDER — CONTOUR NEXT TEST VI STRP: ORAL_STRIP | 12 refills | Status: DC

## 2021-04-26 NOTE — Telephone Encounter (Signed)
Copied from [place] [id number]. Topic: Quick Communication - Rx Refill/Question >> Apr 25, 2021  4:54 PM Pawlus, Brayton Layman A wrote: Pt called in to let Jolene know the test strips sent in do not go with her monitor, pt stated she has the "Contour Next" and needs supplies that work for her monitor, pt also states she prefers to use the [employer] [facility] ([place], [place], please advise.

## 2021-04-29 ENCOUNTER — Other Ambulatory Visit: Payer: Self-pay | Admitting: Nurse Practitioner

## 2021-04-29 MED ORDER — CONTOUR NEXT TEST VI STRP: ORAL_STRIP | 12 refills | Status: DC

## 2021-05-17 ENCOUNTER — Other Ambulatory Visit: Payer: Self-pay

## 2021-05-17 ENCOUNTER — Ambulatory Visit (INDEPENDENT_AMBULATORY_CARE_PROVIDER_SITE_OTHER): Payer: 59

## 2021-05-17 DIAGNOSIS — Z23 Encounter for immunization: Secondary | ICD-10-CM | POA: Diagnosis not present

## 2021-06-20 ENCOUNTER — Telehealth: Payer: Self-pay | Admitting: Obstetrics and Gynecology

## 2021-06-20 DIAGNOSIS — N950 Postmenopausal bleeding: Secondary | ICD-10-CM

## 2021-06-20 NOTE — Telephone Encounter (Signed)
Pt stated she is post menopausal. Stated she is having a heavy flow that resembles a menstrual cycle. Pt stated she started bleeding on Saturday. She was wanting to know if this was normal or do she need to be concerned. Please advise.

## 2021-06-20 NOTE — Telephone Encounter (Signed)
If this is her first episode, we can get an ultrasound to check and see if there is anything of concern. If normal, we can watch and wait.  If she has had prior episodes, then she will need an appointment.

## 2021-06-21 NOTE — Telephone Encounter (Signed)
Called patient she said she has had prior episodes of bleeding last year. She verbalized understanding of scheduling an appointment.

## 2021-06-21 NOTE — Telephone Encounter (Signed)
Ok.  I still would like her to go ahead and schedule an ultrasound appointment as well, perhaps before she is seen so that I can discuss her results and further management at her visit.   Dr. Marcelline Mates

## 2021-06-22 ENCOUNTER — Other Ambulatory Visit: Payer: Self-pay

## 2021-06-22 ENCOUNTER — Ambulatory Visit: Payer: 59 | Admitting: Obstetrics and Gynecology

## 2021-06-22 ENCOUNTER — Encounter: Payer: Self-pay | Admitting: Obstetrics and Gynecology

## 2021-06-22 ENCOUNTER — Ambulatory Visit (INDEPENDENT_AMBULATORY_CARE_PROVIDER_SITE_OTHER): Payer: 59

## 2021-06-22 VITALS — BP 108/55 | HR 70 | Ht 66.0 in | Wt 169.0 lb

## 2021-06-22 DIAGNOSIS — N950 Postmenopausal bleeding: Secondary | ICD-10-CM

## 2021-06-22 MED ORDER — MEDROXYPROGESTERONE ACETATE 10 MG PO TABS: 10.0000 mg | ORAL_TABLET | Freq: Every day | ORAL | 2 refills | Status: DC

## 2021-06-22 NOTE — Progress Notes (Signed)
GYNECOLOGY PROGRESS NOTE  Subjective:    Patient ID: Sydney Horne, female    DOB: Apr 07, 1966, 56 y.o.   MRN: [id number]  HPI  Patient is a 56 y.o. G20P2002 female who presents for complaints of postmenopausal bleeding x 1 week. Notes that she has not had any bleeding for at least 1 year.  Had hormonal levels performed last year which noted that she was in menopause. Bleeding has been dark red blood.   The following portions of the patient's history were reviewed and updated as appropriate: allergies, current medications, past family history, past medical history, past social history, past surgical history, and problem list.  Review of Systems Pertinent items are noted in HPI.   Objective:   Blood pressure (!) 108/55, pulse 70, height 5\' 6"  (1.676 m), weight 169 lb (76.7 kg), SpO2 98 %.  Body mass index is 27.28 kg/m. General appearance: alert and no distress Abdomen: soft, non-tender; bowel sounds normal; no masses,  no organomegaly Pelvic: external genitalia normal, rectovaginal septum normal.  Vagina without discharge, small to moderate amount of blood. Cervix normal appearing, no lesions and no motion tenderness.  Uterus mobile, nontender, normal shape and size.  Adnexae non-palpable, nontender bilaterally. Extremities: extremities normal, atraumatic, no cyanosis or edema Neurologic: Grossly normal   Imaging:  US PELVIC COMPLETE WITH TRANSVAGINAL Patient Name: Sydney Horne DOB: [birth year] MRN: [id number]  ULTRASOUND REPORT  Location: [facility]  Date of Service: 06/22/2021   Indications:Abnormal Uterine Bleeding; PMB Findings:  The uterus is Retroflexed and measures 9.2 x 6.6 x 5.9 cm. Echo texture is heterogenous without evidence of focal masses.  The Endometrium is echogenic and measures 13 mm; contour is irregular and  there is a moderate amount of blood flow demonstrated by color doppler; no  definitive mass or polyp is identified.  Ovaries are seen  transabdominally only. Right Ovary measures 3.1 x 1.8 x 1.7 cm. It is normal in appearance. Left Ovary measures 2.5 x 3.1 x 2.0 cm. It is normal in appearance. Survey of the adnexa demonstrates no adnexal masses. There is no free fluid in the cul de sac.  Impression: 1. Thickened endometrium with no definitive mass identified on ultrasound.  Recommendations: 1.Clinical correlation with the patient's History and Physical Exam.  Gaspar Cola   I have reviewed this study and agree with documented findings.   Rubie Maid, MD [facility]   Assessment:   1. PMB (postmenopausal bleeding)     Plan:   Discussed etiologies of postmenopausal bleeding, concern about precancerous/hyperplasia or cancerous etiology (5 to 10% percent of cases). Also discussed role of unopposed estrogen exposure in leading to thickened or proliferative endometrium; and its possible correlation with endometrial hyperplasia/carcinoma.  Uterine bleeding in postmenopausal women is usually light and self-limited. Exclusion of cancer is the main objective. Recommend this endometrial biopsy, reviewed pelvic ultrasound which did note thickened endometrium.  Will prescribe Provera for treatment of bleeding at this time.    Endometrial Biopsy Procedure Note  The patient is positioned on the exam table in the dorsal lithotomy position. Bimanual exam confirms uterine position and size. A Graves speculum is placed into the vagina. A single toothed tenaculum is placed onto the anterior lip of the cervix. The pipette is placed into the endocervical canal and is advanced to the uterine fundus. Using a piston like technique, with vacuum created by withdrawing the stylus, the endometrial specimen is obtained and transferred to the biopsy container. Minimal bleeding is  encountered. The procedure is well tolerated.   Uterine Position: anterior    Uterine Length: 8 cm   Uterine Specimen: Average  Post  procedure instructions are given. The patient is scheduled for follow up appointment.    Rubie Maid, MD [facility]

## 2021-06-28 LAB — ANATOMIC PATHOLOGY REPORT

## 2021-07-07 ENCOUNTER — Other Ambulatory Visit: Payer: Self-pay

## 2021-07-07 ENCOUNTER — Encounter: Payer: Self-pay | Admitting: Obstetrics and Gynecology

## 2021-07-07 ENCOUNTER — Ambulatory Visit: Payer: 59 | Admitting: Obstetrics and Gynecology

## 2021-07-07 VITALS — BP 108/57 | HR 65 | Resp 16 | Ht 66.0 in | Wt 166.5 lb

## 2021-07-07 DIAGNOSIS — Z01818 Encounter for other preprocedural examination: Secondary | ICD-10-CM

## 2021-07-07 DIAGNOSIS — N950 Postmenopausal bleeding: Secondary | ICD-10-CM

## 2021-07-07 DIAGNOSIS — N840 Polyp of corpus uteri: Secondary | ICD-10-CM | POA: Diagnosis not present

## 2021-07-07 NOTE — Patient Instructions (Signed)
GYNECOLOGY PRE-OPERATIVE INSTRUCTIONS  You are scheduled for surgery on March 24,2023.  The name of your procedure is: Hysteroscopy D&C with polyp removal.   Please read through these instructions carefully regarding preparation for your surgery: Nothing to eat after midnight on the day prior to surgery.  Do not take any medications unless recommended by your provider on day prior to surgery.  Do not take NSAIDs (Motrin, Aleve) or aspirin 7 days prior to surgery.  You may take Tylenol products for minor aches and pains.  You will receive a prescription for pain medications post-operatively.  You will be contacted by phone approximately 1-2 weeks prior to surgery to schedule your pre-operative appointment.  If you are being admitted to the hospital for an overnight stay, you will require COVID testing prior to surgery. Instructions will be given to you on when and where to have this performed.  Please call the office if you have any questions regarding your upcoming surgery.    Thank you for choosing [facility].  Hysteroscopy Hysteroscopy is a procedure used to look inside a woman's womb (uterus). This may be done for various reasons, including: To look for tumors and other growths in the uterus. To evaluate abnormal bleeding, fibroid tumors, polyps, scar tissue, or uterine cancer. To determine why a woman is unable to get pregnant or has had repeated pregnancy losses. To locate an IUD (intrauterine device). To place a birth control device into the fallopian tubes. During this procedure, a thin, flexible tube with a small light and camera (hysteroscope) is used to examine the uterus. The camera sends images to a monitor in the room so that your health care provider can view the inside of your uterus. A hysteroscopy should be done right after a menstrual period. Tell a health care provider about: Any allergies you have. All medicines you are taking, including vitamins, herbs, eye  drops, creams, and over-the-counter medicines. Any problems you or family members have had with anesthetic medicines. Any blood disorders you have. Any surgeries you have had. Any medical conditions you have. Whether you are pregnant or may be pregnant. Whether you have been diagnosed with an STI (sexually transmitted infection) or you think you have an STI. What are the risks? Generally, this is a safe procedure. However, problems may occur, including: Excessive bleeding. Infection. Damage to the uterus or other structures or organs. Allergic reaction to medicines or fluids that are used in the procedure. What happens before the procedure? Staying hydrated Follow instructions from your health care provider about hydration, which may include: Up to 2 hours before the procedure - you may continue to drink clear liquids, such as water, clear fruit juice, black coffee, and plain tea. Eating and drinking restrictions Follow instructions from your health care provider about eating and drinking, which may include: 8 hours before the procedure - stop eating solid foods and drink clear liquids only. 2 hours before the procedure - stop drinking clear liquids. Medicines Ask your health care provider about: Changing or stopping your regular medicines. This is especially important if you are taking diabetes medicines or blood thinners. Taking medicines such as aspirin and ibuprofen. These medicines can thin your blood. Do not take these medicines unless your health care provider tells you to take them. Taking over-the-counter medicines, vitamins, herbs, and supplements. Medicine may be placed in your cervix the day before the procedure. This medicine causes the cervix to open (dilate). The larger opening makes it easier for the hysteroscope to  be inserted into the uterus during the procedure. General instructions Ask your health care provider: What steps will be taken to help prevent infection.  These steps may include: Washing skin with a germ-killing soap. Taking antibiotic medicine. Do not use any products that contain nicotine or tobacco for at least 4 weeks before the procedure. These products include cigarettes, chewing tobacco, and vaping devices, such as e-cigarettes. If you need help quitting, ask your health care provider. Plan to have a responsible adult take you home from the hospital or clinic. Plan to have a responsible adult care for you for the time you are told after you leave the hospital or clinic. This is important. Empty your bladder before the procedure begins. What happens during the procedure? An IV will be inserted into one of your veins. You may be given: A medicine to help you relax (sedative). A medicine that numbs the area around the cervix (local anesthetic). A medicine to make you fall asleep (general anesthetic). A hysteroscope will be inserted through your vagina and into your uterus. Air or fluid will be used to enlarge your uterus to allow your health care provider to see it better. The amount of fluid used will be carefully checked throughout the procedure. In some cases, tissue may be gently scraped from inside the uterus and sent to a lab for testing (biopsy). The procedure may vary among health care providers and hospitals. What can I expect after the procedure? Your blood pressure, heart rate, breathing rate, and blood oxygen level will be monitored until you leave the hospital or clinic. You may have cramps. You may be given medicines for this. You may have bleeding, which may vary from light spotting to menstrual-like bleeding. This is normal. If you had a biopsy, it is up to you to get the results. Ask your health care provider, or the department that is doing the procedure, when your results will be ready. Follow these instructions at home: Activity Rest as told by your health care provider. Return to your normal activities as told by your  health care provider. Ask your health care provider what activities are safe for you. If you were given a sedative during the procedure, it can affect you for several hours. Do not drive or operate machinery until your health care provider says that it is safe. Medicines Do not take aspirin or other NSAIDs during recovery, as told by your healthcare provider. It can increase the risk of bleeding. Ask your health care provider if the medicine prescribed to you: Requires you to avoid driving or using machinery. Can cause constipation. You may need to take these actions to prevent or treat constipation: Drink enough fluid to keep your urine pale yellow. Take over-the-counter or prescription medicines. Eat foods that are high in fiber, such as beans, whole grains, and fresh fruits and vegetables. Limit foods that are high in fat and processed sugars, such as fried or sweet foods. General instructions Do not douche, use tampons, or have sex for 2 weeks after the procedure, or until your health care provider approves. Do not take baths, swim, or use a hot tub until your health care provider approves. Take showers instead of baths for 2 weeks, or for as long as told by your health care provider. Keep all follow-up visits. This is important. Contact a health care provider if: You feel dizzy or lightheaded. You feel nauseous. You have abnormal vaginal discharge. You have a rash. You have pain that does not  get better with medicine. You have chills. Get help right away if: You have bleeding that is heavier than a normal menstrual period. You have a fever. You have pain or cramps that get worse. You develop new abdominal pain. You faint. You have pain in your shoulder. You are short of breath. Summary Hysteroscopy is a procedure that is used to look inside a woman's womb (uterus). After the procedure, you may have bleeding, which varies from light spotting to menstrual-like bleeding. This is  normal. You may also have cramps. Do not douche, use tampons, or have sex for 2 weeks after the procedure, or until your health care provider approves. Plan to have a responsible adult take you home from the hospital or clinic. This information is not intended to replace advice given to you by your health care provider. Make sure you discuss any questions you have with your health care provider. Document Revised: 01/12/2021 Document Reviewed: 12/17/2019 Elsevier Patient Education  2022 [employer].

## 2021-07-07 NOTE — Progress Notes (Signed)
° ° °  GYNECOLOGY PROGRESS NOTE  Subjective:    Patient ID: Sydney Horne, female    DOB: 06-20-1965, 56 y.o.   MRN: [id number]  HPI  Patient is a 56 y.o. G51P2002 female who presents for follow up on endometrial biopsy results and ultrasound. She took the Provera as prescribed for ten days. Bleeding did stop, but she doesn't think it resolved issues. She had  The following portions of the patient's history were reviewed and updated as appropriate: allergies, current medications, past family history, past medical history, past social history, past surgical history, and problem list.  Review of Systems Pertinent items noted in HPI and remainder of comprehensive ROS otherwise negative.   Objective:   Blood pressure (!) 108/57, pulse 65, resp. rate 16, height 5\' 6"  (1.676 m), weight 166 lb 8 oz (75.5 kg), last menstrual period 05/15/2020. Body mass index is 26.87 kg/m. General appearance: alert and no distress Abdomen: soft, non-tender; bowel sounds normal; no masses,  no organomegaly Pelvic: deferred. Please see exam from previous note dated 07/02/2021.    Endometrial Biopsy on 06/22/2021 and results were:  BENIGN ENDOMETRIAL POLYP.  PROLIFERATIVE ENDOMETRIUM. NO HYPERPLASIA, CARCINOMA OR  ENDOMETRITIS.      Imaging:  US PELVIC COMPLETE WITH TRANSVAGINAL Patient Name: Sydney Horne DOB: 1965-09-19 MRN: [id number]  ULTRASOUND REPORT  Location: [facility]  Date of Service: 06/22/2021   Indications:Abnormal Uterine Bleeding; PMB Findings:  The uterus is Retroflexed and measures 9.2 x 6.6 x 5.9 cm. Echo texture is heterogenous without evidence of focal masses.  The Endometrium is echogenic and measures 13 mm; contour is irregular and  there is a moderate amount of blood flow demonstrated by color doppler; no  definitive mass or polyp is identified.  Ovaries are seen transabdominally only. Right Ovary measures 3.1 x 1.8 x 1.7 cm. It is normal in appearance. Left Ovary  measures 2.5 x 3.1 x 2.0 cm. It is normal in appearance. Survey of the adnexa demonstrates no adnexal masses. There is no free fluid in the cul de sac.  Impression: 1. Thickened endometrium with no definitive mass identified on ultrasound.  Recommendations: 1.Clinical correlation with the patient's History and Physical Exam.  Gaspar Cola   I have reviewed this study and agree with documented findings.   Rubie Maid, MD [facility]   Assessment:   PMB Endometrial polyp  Plan:   Discussion had regarding endometrial biopsy findings. Patient with benign endometrial polyp.  In the setting of postmenopausal bleeding, it is recommended for polyp removal.  This can be performed by Hysteroscopy D&C with polypectomy. Discussed nature of procedure, including risks and benefits. Advised that the likelihood of resolution of her symptoms was high. All questions answered. Patient notes understanding.  She is ok with recommended procedure and will plan to schedule surgery tentatively for March 24th.  Pre-op done today.  Given pre-operative instructions.    A total of 20 minutes were spent face-to-face with the patient during this encounter and over half of that time dealt with counseling and coordination of care.    Rubie Maid, MD [facility]

## 2021-07-07 NOTE — H&P (Signed)
GYNECOLOGY PREOPERATIVE HISTORY AND PHYSICAL   Subjective:  Sydney Horne is a 56 y.o. [id number] here for surgical management of endometrial polyp, postmenopausal bleeding. No significant preoperative concerns.   Proposed surgery: Hysteroscopy Dilation and Curettage, Endometrial Polypectomy   Pertinent Gynecological History: Menses: post-menopausal Bleeding: post menopausal bleeding Last mammogram: normal Date: 8/262022 Last pap: normal Date: 11/22/2018   Past Medical History:  Diagnosis Date   Environmental allergies    Headache    2x/mo   Heart murmur    followed by PCP   Kidney stones    Neck pain    sees chiropractor for adjustment when having problems   Vertigo 2017   2 episodes    Past Surgical History:  Procedure Laterality Date   bladder control surgery     TVT, performed in 2015   COLONOSCOPY WITH PROPOFOL N/A 11/27/2016   Procedure: COLONOSCOPY WITH PROPOFOL;  Surgeon: Lucilla Lame, MD;  Location: [place];  Service: Endoscopy;  Laterality: N/A;    OB History  Gravida Para Term Preterm AB Living  _0 SAB IAB Ectopic Multiple Live Births          2    # Outcome Date GA Lbr Len/2nd Weight Sex Delivery Anes PTL Lv  2 Term 1999   8 lb 12.8 oz (3.992 kg) M Vag-Spont   LIV  1 Term 1997   8 lb 1.6 oz (3.674 kg) F Vag-Spont   LIV    Family History  Problem Relation Age of Onset   Breast cancer Mother 46   Cancer Mother        breast   Cancer Father        prostate   Breast cancer Sister 68   Cancer Sister        breast   Diabetes Sister    Colon cancer Sister    Breast cancer Maternal Aunt    Breast cancer Paternal Aunt    Breast cancer Paternal Grandmother    Cancer Paternal Grandmother        breast   Diabetes Maternal Grandmother    Heart attack Maternal Grandfather    Diabetes Sister    Ovarian cancer Neg Hx     Social History   Socioeconomic History   Marital status: Married    Spouse name: Not on file   Number  of children: Not on file   Years of education: Not on file   Highest education level: Not on file  Occupational History   Not on file  Tobacco Use   Smoking status: Never   Smokeless tobacco: Never  Vaping Use   Vaping Use: Never used  Substance and Sexual Activity   Alcohol use: Not Currently    Comment: 1 drink/mo   Drug use: No   Sexual activity: Yes    Birth control/protection: None  Other Topics Concern   Not on file  Social History Narrative   Not on file   Social Determinants of Health   Financial Resource Strain: Low Risk    Difficulty of Paying Living Expenses: Not hard at all  Food Insecurity: No Food Insecurity   Worried About Charity fundraiser in the Last Year: Never true   Ran Out of Food in the Last Year: Never true  Transportation Needs: No Transportation Needs   Lack of Transportation (Medical): No   Lack of Transportation (Non-Medical): No  Physical Activity: Sufficiently Active  Days of Exercise per Week: 4 days   Minutes of Exercise per Session: 50 min  Stress: No Stress Concern Present   Feeling of Stress : Only a little  Social Connections: Moderately Integrated   Frequency of Communication with Friends and Family: More than three times a week   Frequency of Social Gatherings with Friends and Family: More than three times a week   Attends Religious Services: 1 to 4 times per year   Active Member of [employer] or Organizations: No   Attends Music therapist: Never   Marital Status: Married  Human resources officer Violence: Not At Risk   Fear of Current or Ex-Partner: No   Emotionally Abused: No   Physically Abused: No   Sexually Abused: No    Current Outpatient Medications on File Prior to Visit  Medication Sig Dispense Refill   Blood Glucose Monitoring Suppl (ONETOUCH VERIO) w/Device KIT Use to check blood sugar twice a day, fasting in morning and 2 hours after a meal.  Goals: < 130 fasting in morning and <180 two hours after your eat. 1  kit 0   calcium citrate-vitamin D (CITRACAL+D) 315-200 MG-UNIT tablet Take 1 tablet by mouth daily.     Cranberry (THERACRAN ONE PO) Take 1 tablet by mouth daily.     cyclobenzaprine (FLEXERIL) 10 MG tablet Take 1 tablet (10 mg total) by mouth 3 (three) times daily as needed for muscle spasms. 60 tablet 0   gabapentin (NEURONTIN) 100 MG capsule Take 1-2 capsules (100-200 mg total) by mouth at bedtime as needed. 30 capsule 0   glucose blood (CONTOUR NEXT TEST) test strip Use to check blood sugar twice a day, fasting in morning and 2 hours after a meal.  Goals: < 130 fasting in morning and <180 two hours after your eat. 100 each 12   ibuprofen (ADVIL,MOTRIN) 200 MG tablet Take 200 mg by mouth every 6 (six) hours as needed for headache.     Lancets (ONETOUCH ULTRASOFT) lancets Use to check blood sugar twice a day, fasting in morning and 2 hours after a meal.  Goals: < 130 fasting in morning and <180 two hours after your eat. 100 each 12   medroxyPROGESTERone (PROVERA) 10 MG tablet Take 1 tablet (10 mg total) by mouth daily. Use for ten days 10 tablet 2   meloxicam (MOBIC) 15 MG tablet Take 1 tablet (15 mg total) by mouth daily as needed for pain. 30 tablet 0   Multiple Vitamin (MULTIVITAMIN) tablet Take 1 tablet by mouth daily.     No current facility-administered medications on file prior to visit.   Allergies  Allergen Reactions   Other Nausea And Vomiting    sauerkraut   Zithromax [Azithromycin] Diarrhea        Adhesive [Tape] Rash    Band-aids      Review of Systems Constitutional: No recent fever/chills/sweats Respiratory: No recent cough/bronchitis Cardiovascular: No chest pain Gastrointestinal: No recent nausea/vomiting/diarrhea Genitourinary: No UTI symptoms Hematologic/lymphatic:No history of coagulopathy or recent blood thinner use    Objective:   Blood pressure (!) 108/57, pulse 65, resp. rate 16, height _0  (1.676 m), weight 166 lb 8 oz (75.5 kg), last menstrual period  05/15/2020. CONSTITUTIONAL: Well-developed, well-nourished female in no acute distress.  HENT:  Normocephalic, atraumatic, External right and left ear normal. Oropharynx is clear and moist EYES: Conjunctivae and EOM are normal. Pupils are equal, round, and reactive to light. No scleral icterus.  NECK: Normal range of motion, supple, no  masses SKIN: Skin is warm and dry. No rash noted. Not diaphoretic. No erythema. No pallor. NEUROLOGIC: Alert and oriented to person, place, and time. Normal reflexes, muscle tone coordination. No cranial nerve deficit noted. PSYCHIATRIC: Normal mood and affect. Normal behavior. Normal judgment and thought content. CARDIOVASCULAR: Normal heart rate noted, regular rhythm RESPIRATORY: Effort and breath sounds normal, no problems with respiration noted ABDOMEN: Soft, nontender, nondistended. PELVIC: Deferred MUSCULOSKELETAL: Normal range of motion. No edema and no tenderness. 2+ distal pulses.    Labs:  Lab Results  Component Value Date   WBC 9.3 03/14/2021   HGB 14.1 03/14/2021   HCT 42.8 03/14/2021   MCV 92 03/14/2021   PLT 198 03/14/2021     Pathology (06/23/2021):  Endometrial Biopsy: BENIGN ENDOMETRIAL POLYP.  PROLIFERATIVE ENDOMETRIUM. NO HYPERPLASIA, CARCINOMA OR  ENDOMETRITIS.  Imaging Studies: US PELVIC COMPLETE WITH TRANSVAGINAL  Result Date: 06/22/2021 Patient Name: KASSIE KENG DOB: 12/09/1965 MRN: [id number] ULTRASOUND REPORT Location: [facility] Date of Service: 06/22/2021 Indications:Abnormal Uterine Bleeding; PMB Findings: The uterus is Retroflexed and measures 9.2 x 6.6 x 5.9 cm. Echo texture is heterogenous without evidence of focal masses. The Endometrium is echogenic and measures 13 mm; contour is irregular and there is a moderate amount of blood flow demonstrated by color doppler; no definitive mass or polyp is identified. Ovaries are seen transabdominally only. Right Ovary measures 3.1 x 1.8 x 1.7 cm. It is normal in  appearance. Left Ovary measures 2.5 x 3.1 x 2.0 cm. It is normal in appearance. Survey of the adnexa demonstrates no adnexal masses. There is no free fluid in the cul de sac. Impression: 1. Thickened endometrium with no definitive mass identified on ultrasound. Recommendations: 1.Clinical correlation with the patient's History and Physical Exam. Gaspar Cola I have reviewed this study and agree with documented findings. Rubie Maid, MD [facility]    Assessment:    1. PMB (postmenopausal bleeding)   2. Endometrial polyp       Plan:   Counseling: Procedure, risks, reasons, benefits and complications (including injury to bowel, bladder, major blood vessel, bleeding, possibility of transfusion, infection, or fistula formation) reviewed in detail. Likelihood of success in alleviating the patient's condition was discussed. Routine postoperative instructions will be reviewed with the patient and her family in detail after surgery.  The patient concurred with the proposed plan, giving informed written consent for the surgery.   Preop testing ordered. Instructions reviewed, including NPO after midnight.     Rubie Maid, MD [facility]

## 2021-07-25 ENCOUNTER — Other Ambulatory Visit: Payer: Self-pay | Admitting: Obstetrics and Gynecology

## 2021-07-29 ENCOUNTER — Encounter
Admission: RE | Admit: 2021-07-29 | Discharge: 2021-07-29 | Disposition: A | Discharge status: Home / Self Care | Payer: 59 | Source: Ambulatory Visit | Attending: Obstetrics and Gynecology | Admitting: Obstetrics and Gynecology

## 2021-07-29 ENCOUNTER — Other Ambulatory Visit: Payer: Self-pay

## 2021-07-29 HISTORY — DX: Prediabetes: R73.03

## 2021-07-29 HISTORY — DX: Personal history of urinary calculi: Z87.442

## 2021-07-29 NOTE — Patient Instructions (Signed)
Your procedure is scheduled on: 08/05/21 Report to [place]. To find out your arrival time please call [phone] between 1PM - 3PM on 08/04/21.  Remember: Instructions that are not followed completely may result in serious medical risk, up to and including death, or upon the discretion of your surgeon and anesthesiologist your surgery may need to be rescheduled.     _X__ 1. Do not eat food or drink any liquids after midnight the night before your procedure.                 No gum chewing or hard candies.   __X__2.  On the morning of surgery brush your teeth with toothpaste and water, you                 may rinse your mouth with mouthwash if you wish.  Do not swallow any              toothpaste of mouthwash.     _X__ 3.  No Alcohol for 24 hours before or after surgery.   _X__ 4.  Do Not Smoke or use e-cigarettes For 24 Hours Prior to Your Surgery.                 Do not use any chewable tobacco products for at least 6 hours prior to                 surgery.  ____  5.  Bring all medications with you on the day of surgery if instructed.   __X__  6.  Notify your doctor if there is any change in your medical condition      (cold, fever, infections).     Do not wear jewelry, make-up, hairpins, clips or nail polish. Do not wear lotions, powders, or perfumes. You may wear deodorant Do not shave body hair 48 hours prior to surgery. Men may shave face and neck. Do not bring valuables to the hospital.    [facility] is not responsible for any belongings or valuables.  Contacts, dentures/partials or body piercings may not be worn into surgery. Bring a case for your contacts, glasses or hearing aids, a denture cup will be supplied. Leave your suitcase in the car. After surgery it may be brought to your room. For patients admitted to the hospital, discharge time is determined by your treatment team.   Patients discharged the day of  surgery will not be allowed to drive home.   Please read over the following fact sheets that you were given:     __X__ Take these medicines the morning of surgery with A SIP OF WATER:    1. none  2.   3.   4.  5.  6.  ____ Fleet Enema (as directed)   ____ Use CHG Soap/SAGE wipes as directed  ____ Use inhalers on the day of surgery  ____ Stop metformin/Janumet/Farxiga 2 days prior to surgery    ____ Take 1/2 of usual insulin dose the night before surgery. No insulin the morning          of surgery.   ____ Stop Blood Thinners Coumadin/Plavix/Xarelto/Pleta/Pradaxa/Eliquis/Effient/Aspirin  on   Or contact your Surgeon, Cardiologist or Medical Doctor regarding  ability to stop your blood thinners  __X__ Stop Anti-inflammatories 7 days before surgery such as Advil, Ibuprofen, Motrin,  BC or Goodies Powder, Naprosyn, Naproxen, Aleve, Aspirin   You may take Tylenol if needed  __X__ Stop all herbals and supplements, fish oil or vitamins  until after surgery.    ____ Bring C-Pap to the hospital.

## 2021-08-01 ENCOUNTER — Other Ambulatory Visit: Payer: Self-pay

## 2021-08-01 ENCOUNTER — Other Ambulatory Visit
Admission: RE | Admit: 2021-08-01 | Discharge: 2021-08-01 | Disposition: A | Discharge status: Home / Self Care | Payer: 59 | Source: Ambulatory Visit | Attending: Obstetrics and Gynecology | Admitting: Obstetrics and Gynecology

## 2021-08-01 DIAGNOSIS — Z01818 Encounter for other preprocedural examination: Secondary | ICD-10-CM

## 2021-08-01 DIAGNOSIS — Z01812 Encounter for preprocedural laboratory examination: Secondary | ICD-10-CM | POA: Diagnosis present

## 2021-08-01 LAB — CBC
HCT: 42.9 % (ref 36.0–46.0)
Hemoglobin: 14 g/dL (ref 12.0–15.0)
MCH: 29.9 pg (ref 26.0–34.0)
MCHC: 32.6 g/dL (ref 30.0–36.0)
MCV: 91.5 fL (ref 80.0–100.0)
Platelets: 202 10*3/uL (ref 150–400)
RBC: 4.69 MIL/uL (ref 3.87–5.11)
RDW: 13.6 % (ref 11.5–15.5)
WBC: 8.2 10*3/uL (ref 4.0–10.5)
nRBC: 0 % (ref 0.0–0.2)

## 2021-08-05 ENCOUNTER — Ambulatory Visit: Payer: 59 | Admitting: Urgent Care

## 2021-08-05 ENCOUNTER — Other Ambulatory Visit: Payer: Self-pay

## 2021-08-05 ENCOUNTER — Encounter: Payer: Self-pay | Admitting: Obstetrics and Gynecology

## 2021-08-05 ENCOUNTER — Ambulatory Visit
Admission: RE | Admit: 2021-08-05 | Discharge: 2021-08-05 | Disposition: A | Discharge status: Home / Self Care | Payer: 59 | Attending: Obstetrics and Gynecology | Admitting: Obstetrics and Gynecology

## 2021-08-05 ENCOUNTER — Encounter
Admission: RE | Disposition: A | Discharge status: Home / Self Care | Payer: Self-pay | Source: Home / Self Care | Attending: Obstetrics and Gynecology

## 2021-08-05 ENCOUNTER — Ambulatory Visit: Payer: 59 | Admitting: Certified Registered"

## 2021-08-05 DIAGNOSIS — N858 Other specified noninflammatory disorders of uterus: Secondary | ICD-10-CM | POA: Insufficient documentation

## 2021-08-05 DIAGNOSIS — N840 Polyp of corpus uteri: Secondary | ICD-10-CM | POA: Diagnosis not present

## 2021-08-05 DIAGNOSIS — N950 Postmenopausal bleeding: Secondary | ICD-10-CM | POA: Diagnosis present

## 2021-08-05 HISTORY — PX: DILATATION & CURETTAGE/HYSTEROSCOPY WITH MYOSURE: SHX6511

## 2021-08-05 SURGERY — DILATATION & CURETTAGE/HYSTEROSCOPY WITH MYOSURE
Anesthesia: General | Site: Uterus

## 2021-08-05 MED ORDER — OXYCODONE HCL 5 MG PO TABS: 5.0000 mg | ORAL_TABLET | Freq: Once | ORAL | Status: DC | PRN

## 2021-08-05 MED ORDER — ORAL CARE MOUTH RINSE: 15.0000 mL | Freq: Once | OROMUCOSAL | Status: AC

## 2021-08-05 MED ORDER — DEXAMETHASONE SODIUM PHOSPHATE 10 MG/ML IJ SOLN
INTRAMUSCULAR | Status: DC | PRN
  Administered 2021-08-05: 10 mg via INTRAVENOUS

## 2021-08-05 MED ORDER — CHLORHEXIDINE GLUCONATE 0.12 % MT SOLN
15.0000 mL | Freq: Once | OROMUCOSAL | Status: AC
  Administered 2021-08-05: 15 mL via OROMUCOSAL

## 2021-08-05 MED ORDER — ONDANSETRON HCL 4 MG/2ML IJ SOLN: 4.0000 mg | Freq: Once | INTRAMUSCULAR | Status: DC | PRN

## 2021-08-05 MED ORDER — FAMOTIDINE 20 MG PO TABS: 20.0000 mg | ORAL_TABLET | Freq: Once | ORAL | Status: DC

## 2021-08-05 MED ORDER — GLYCOPYRROLATE 0.2 MG/ML IJ SOLN
INTRAMUSCULAR | Status: DC | PRN
  Administered 2021-08-05: .2 mg via INTRAVENOUS

## 2021-08-05 MED ORDER — ACETAMINOPHEN 500 MG PO TABS
ORAL_TABLET | ORAL | Status: AC
  Filled 2021-08-05: qty 2

## 2021-08-05 MED ORDER — LACTATED RINGERS IV SOLN: INTRAVENOUS | Status: DC | PRN

## 2021-08-05 MED ORDER — FENTANYL CITRATE (PF) 100 MCG/2ML IJ SOLN: 25.0000 ug | INTRAMUSCULAR | Status: DC | PRN

## 2021-08-05 MED ORDER — PROPOFOL 500 MG/50ML IV EMUL
INTRAVENOUS | Status: AC
  Filled 2021-08-05: qty 50

## 2021-08-05 MED ORDER — ONDANSETRON HCL 4 MG/2ML IJ SOLN
INTRAMUSCULAR | Status: DC | PRN
  Administered 2021-08-05: 4 mg via INTRAVENOUS

## 2021-08-05 MED ORDER — CEFAZOLIN SODIUM-DEXTROSE 2-4 GM/100ML-% IV SOLN
INTRAVENOUS | Status: AC
  Filled 2021-08-05: qty 100

## 2021-08-05 MED ORDER — LIDOCAINE HCL (CARDIAC) PF 100 MG/5ML IV SOSY
PREFILLED_SYRINGE | INTRAVENOUS | Status: DC | PRN
  Administered 2021-08-05: 100 mg via INTRAVENOUS

## 2021-08-05 MED ORDER — MIDAZOLAM HCL 2 MG/2ML IJ SOLN
INTRAMUSCULAR | Status: DC | PRN
Start: 2021-08-05 — End: 2021-08-05
  Administered 2021-08-05: 2 mg via INTRAVENOUS

## 2021-08-05 MED ORDER — FENTANYL CITRATE (PF) 100 MCG/2ML IJ SOLN
INTRAMUSCULAR | Status: DC | PRN
  Administered 2021-08-05: 50 ug via INTRAVENOUS

## 2021-08-05 MED ORDER — SODIUM CHLORIDE 0.9 % IR SOLN
Status: DC | PRN
  Administered 2021-08-05: 773 mL

## 2021-08-05 MED ORDER — FENTANYL CITRATE (PF) 100 MCG/2ML IJ SOLN
INTRAMUSCULAR | Status: AC
  Filled 2021-08-05: qty 2

## 2021-08-05 MED ORDER — CHLORHEXIDINE GLUCONATE 0.12 % MT SOLN
OROMUCOSAL | Status: AC
  Filled 2021-08-05: qty 15

## 2021-08-05 MED ORDER — OXYCODONE HCL 5 MG/5ML PO SOLN: 5.0000 mg | Freq: Once | ORAL | Status: DC | PRN

## 2021-08-05 MED ORDER — SEVOFLURANE IN SOLN
RESPIRATORY_TRACT | Status: AC
  Filled 2021-08-05: qty 250

## 2021-08-05 MED ORDER — LACTATED RINGERS IV SOLN: INTRAVENOUS | Status: DC

## 2021-08-05 MED ORDER — KETOROLAC TROMETHAMINE 30 MG/ML IJ SOLN
INTRAMUSCULAR | Status: DC | PRN
  Administered 2021-08-05: 30 mg via INTRAVENOUS

## 2021-08-05 MED ORDER — PROPOFOL 10 MG/ML IV BOLUS
INTRAVENOUS | Status: DC | PRN
  Administered 2021-08-05: 150 mg via INTRAVENOUS

## 2021-08-05 MED ORDER — MIDAZOLAM HCL 2 MG/2ML IJ SOLN
INTRAMUSCULAR | Status: AC
Start: 2021-08-05 — End: ?
  Filled 2021-08-05: qty 2

## 2021-08-05 MED ORDER — EPHEDRINE SULFATE (PRESSORS) 50 MG/ML IJ SOLN
INTRAMUSCULAR | Status: DC | PRN
  Administered 2021-08-05: 15 mg via INTRAVENOUS

## 2021-08-05 MED ORDER — ACETAMINOPHEN 500 MG PO TABS
1000.0000 mg | ORAL_TABLET | ORAL | Status: AC
  Administered 2021-08-05: 1000 mg via ORAL

## 2021-08-05 MED ORDER — PHENYLEPHRINE HCL-NACL 20-0.9 MG/250ML-% IV SOLN
INTRAVENOUS | Status: AC
  Filled 2021-08-05: qty 250

## 2021-08-05 SURGICAL SUPPLY — 22 items
BACTOSHIELD CHG 4% 4OZ (MISCELLANEOUS) ×1
CANISTER SUC SOCK COL 7IN (MISCELLANEOUS) ×2 IMPLANT
DEVICE MYOSURE LITE (MISCELLANEOUS) ×1 IMPLANT
DRSG TELFA 3X8 NADH (GAUZE/BANDAGES/DRESSINGS) ×2 IMPLANT
ELECT REM PT RETURN 9FT ADLT (ELECTROSURGICAL) ×2
ELECTRODE REM PT RTRN 9FT ADLT (ELECTROSURGICAL) ×1 IMPLANT
GLOVE SURG ENC MOIS LTX SZ6.5 (GLOVE) ×2 IMPLANT
GOWN STRL REUS W/ TWL LRG LVL3 (GOWN DISPOSABLE) ×2 IMPLANT
GOWN STRL REUS W/TWL LRG LVL3 (GOWN DISPOSABLE) ×4
IV NS IRRIG 3000ML ARTHROMATIC (IV SOLUTION) ×2 IMPLANT
KIT PROCEDURE FLUENT (KITS) ×1 IMPLANT
KIT TURNOVER CYSTO (KITS) ×2 IMPLANT
MANIFOLD NEPTUNE II (INSTRUMENTS) ×2 IMPLANT
PACK DNC HYST (MISCELLANEOUS) ×2 IMPLANT
PAD DRESSING TELFA 3X8 NADH (GAUZE/BANDAGES/DRESSINGS) IMPLANT
PAD OB MATERNITY 4.3X12.25 (PERSONAL CARE ITEMS) ×2 IMPLANT
PAD PREP 24X41 OB/GYN DISP (PERSONAL CARE ITEMS) ×2 IMPLANT
SCRUB CHG 4% DYNA-HEX 4OZ (MISCELLANEOUS) ×1 IMPLANT
SEAL ROD LENS SCOPE MYOSURE (ABLATOR) ×2 IMPLANT
SET CYSTO W/LG BORE CLAMP LF (SET/KITS/TRAYS/PACK) IMPLANT
TUBING CONNECTING 10 (TUBING) IMPLANT
WATER STERILE IRR 500ML POUR (IV SOLUTION) ×2 IMPLANT

## 2021-08-05 NOTE — Anesthesia Procedure Notes (Signed)
Procedure Name: LMA Insertion ?Date/Time: 08/05/2021 7:32 AM ?Performed by: Beverely Low, CRNA ?Pre-anesthesia Checklist: Patient identified, Patient being monitored, Timeout performed, Emergency Drugs available and Suction available ?Patient Re-evaluated:Patient Re-evaluated prior to induction ?Oxygen Delivery Method: Circle system utilized ?Preoxygenation: Pre-oxygenation with 100% oxygen ?Induction Type: IV induction ?Ventilation: Mask ventilation without difficulty ?LMA: LMA inserted ?LMA Size: 3.5 ?Tube type: Oral ?Number of attempts: 1 ?Placement Confirmation: positive ETCO2 and breath sounds checked- equal and bilateral ?Tube secured with: Tape ?Dental Injury: Teeth and Oropharynx as per pre-operative assessment  ? ? ? ? ?

## 2021-08-05 NOTE — Anesthesia Preprocedure Evaluation (Signed)
Anesthesia Evaluation  ?Patient identified by MRN, date of birth, ID band ?Patient awake ? ? ? ?Reviewed: ?Allergy & Precautions, NPO status , Patient's Chart, lab work & pertinent test results ? ?History of Anesthesia Complications ?Negative for: history of anesthetic complications ? ?Airway ?Mallampati: II ? ?TM Distance: >3 FB ?Neck ROM: Full ? ? ? Dental ?no notable dental hx. ?(+) Teeth Intact ?  ?Pulmonary ?neg pulmonary ROS, neg sleep apnea, neg COPD, Patient abstained from smoking.Not current smoker,  ?  ?Pulmonary exam normal ?breath sounds clear to auscultation ? ? ? ? ? ? Cardiovascular ?Exercise Tolerance: Good ?METS(-) hypertension(-) CAD and (-) Past MI negative cardio ROS ? ?(-) dysrhythmias  ?Rhythm:Regular Rate:Normal ?- Systolic murmurs ? ?  ?Neuro/Psych ? Headaches, negative psych ROS  ? GI/Hepatic ?neg GERD  ,(+)  ?  ? (-) substance abuse ? ,   ?Endo/Other  ?neg diabetes ? Renal/GU ?negative Renal ROS  ? ?  ?Musculoskeletal ? ? Abdominal ?  ?Peds ? Hematology ?  ?Anesthesia Other Findings ?Past Medical History: ?No date: Environmental allergies ?No date: Headache ?    Comment:  2x/mo ?No date: Heart murmur ?    Comment:  followed by PCP ?No date: History of kidney stones ?No date: Neck pain ?    Comment:  sees chiropractor for adjustment when having problems ?No date: Pre-diabetes ?2017: Vertigo ?    Comment:  2 episodes ? Reproductive/Obstetrics ? ?  ? ? ? ? ? ? ? ? ? ? ? ? ? ?  ?  ? ? ? ? ? ? ? ? ?Anesthesia Physical ?Anesthesia Plan ? ?ASA: 2 ? ?Anesthesia Plan: General  ? ?Post-op Pain Management: Ofirmev IV (intra-op)* and Toradol IV (intra-op)*  ? ?Induction: Intravenous ? ?PONV Risk Score and Plan: 4 or greater and Ondansetron, Dexamethasone and Midazolam ? ?Airway Management Planned: LMA ? ?Additional Equipment: None ? ?Intra-op Plan:  ? ?Post-operative Plan: Extubation in OR ? ?Informed Consent: I have reviewed the patients History and Physical, chart,  labs and discussed the procedure including the risks, benefits and alternatives for the proposed anesthesia with the patient or authorized representative who has indicated his/her understanding and acceptance.  ? ? ? ?Dental advisory given ? ?Plan Discussed with: CRNA and Surgeon ? ?Anesthesia Plan Comments: (Discussed risks of anesthesia with patient, including PONV, sore throat, lip/dental/eye damage. Rare risks discussed as well, such as cardiorespiratory and neurological sequelae, and allergic reactions. Discussed the role of CRNA in patient's perioperative care. Patient understands.)  ? ? ? ? ? ? ?Anesthesia Quick Evaluation ? ?

## 2021-08-05 NOTE — Op Note (Signed)
Procedure(s): ?DILATATION & CURETTAGE/HYSTEROSCOPY WITH MYOSURE; ENDOMETRIAL POLYPECTOMY Procedure Note ? ?Skarlette THECLA FORGIONE ?female ?56 y.o. ?08/05/2021 ? ?Indications: The patient is a 56 y.o. [id number] female with postmenopausal bleeding, endometrial polyp noted on in office biopsy. ? ?Pre-operative Diagnosis: Postmenopausal bleeding, endometrial polyp ? ?Post-operative Diagnosis: Same ? ?Surgeon: Rubie Maid, MD ? ?Assistants:  None. ? ?Anesthesia: General endotracheal anesthesia ? ?Findings: ?Uterus sounded to 8 cm.   ?Atrophic endometrium.  Tubal ostia were visualized bilaterally.  Polypoid lesion noted on left anterior uterine surface.  ? ?Procedure Details: ?The patient was seen in the Holding Room. The risks, benefits, complications, treatment options, and expected outcomes were discussed with the patient.  The patient concurred with the proposed plan, giving informed consent.  The site of surgery properly noted/marked. The patient was taken to the Operating Room, identified as [facility] INES REBEL and the procedure verified as Procedure(s) (LRB): DILATATION & CURETTAGE/HYSTEROSCOPY WITH MYOSURE; ENDOMETRIAL POLYPECTOMY (N/A).   ? ?The patient was then placed under general anesthesia without difficulty.  She was then prepped and draped in the normal sterile fashion, and placed in the dorsal lithotomy position.  A time out was performed.  An exam under anesthesia was performed with the findings noted above.  Straight catheterization was performed. A sterile speculum was inserted into vagina. A single-tooth tenaculum was used to grasp the anterior lip of the cervix. Cervical dilation was performed. A 5 mm hysteroscope was introduced into the uterus under direct visualization. The cavity was allowed to fill, and then the entire cavity was explored with the findings described above. The Myosure device was entered into the uterine cavity and a polypectomy was performed.  The hysteroscope was removed, and a sharp curette was  then passed into the uterus and endometrial sampling was collected for pathology. The tenaculum was removed and excellent hemostasis was noted. The speculum was removed from the vagina.  ? ?All instrument and sponge counts were correct at the end of the procedure x 2.  The patient tolerated the procedure well.  She was awakened and taken to the PACU in stable condition.  ? ? ?Estimated Blood Loss:  minimal ?     ?Drains: straight catheterization prior to procedure with  50 ml of clear urine ?        ?Total IV Fluids:  600 ml ? ?Specimens: Endometrial polyp, endometrial curettings ?        ?Implants: None ?        ?Complications:  None; patient tolerated the procedure well. ?        ?Disposition: PACU - hemodynamically stable. ?        ?Condition: stable ? ? ?Rubie Maid, MD ?[facility] ? ?

## 2021-08-05 NOTE — Interval H&P Note (Signed)
History and Physical Interval Note: ? ?08/05/2021 ?7:16 AM ? ?Sydney Horne  has presented today for surgery, with the diagnosis of Postmenopausal bleeding, Endometrial Polyp.  The various methods of treatment have been discussed with the patient and family. After consideration of risks, benefits and other options for treatment, the patient has consented to  Procedure(s): ?DILATATION & CURETTAGE/HYSTEROSCOPY WITH MYOSURE (N/A) as a surgical intervention.  The patient's history has been reviewed, patient examined, no change in status, stable for surgery.  I have reviewed the patient's chart and labs.  Questions were answered to the patient's satisfaction.   ? ? ?Rubie Maid, MD ?[facility] ? ? ?

## 2021-08-05 NOTE — Transfer of Care (Signed)
Immediate Anesthesia Transfer of Care Note ? ?Patient: Sydney Horne ? ?Procedure(s) Performed: DILATATION & CURETTAGE/HYSTEROSCOPY WITH MYOSURE (Uterus) ? ?Patient Location: PACU ? ?Anesthesia Type:General ? ?Level of Consciousness: drowsy ? ?Airway & Oxygen Therapy: Patient Spontanous Breathing and Patient connected to face mask oxygen ? ?Post-op Assessment: Report given to RN and Post -op Vital signs reviewed and stable ? ?Post vital signs: Reviewed and stable ? ?Last Vitals:  ?Vitals Value Taken Time  ?BP 97/45 08/05/21 0815  ?Temp    ?Pulse 66 08/05/21 0817  ?Resp 11 08/05/21 0817  ?SpO2 97 % 08/05/21 0817  ?Vitals shown include unvalidated device data. ? ?Last Pain:  ?Vitals:  ? 08/05/21 0625  ?TempSrc: Oral  ?PainSc: 0-No pain  ?   ? ?  ? ?Complications: No notable events documented. ?

## 2021-08-05 NOTE — Anesthesia Postprocedure Evaluation (Signed)
Anesthesia Post Note ? ?Patient: CIANNI MANNY ? ?Procedure(s) Performed: DILATATION & CURETTAGE/HYSTEROSCOPY WITH MYOSURE; ENDOMETRIAL POLYPECTOMY (Uterus) ? ?Patient location during evaluation: PACU ?Anesthesia Type: General ?Level of consciousness: awake and alert ?Pain management: pain level controlled ?Vital Signs Assessment: post-procedure vital signs reviewed and stable ?Respiratory status: spontaneous breathing, nonlabored ventilation, respiratory function stable and patient connected to nasal cannula oxygen ?Cardiovascular status: blood pressure returned to baseline and stable ?Postop Assessment: no apparent nausea or vomiting ?Anesthetic complications: no ? ? ?No notable events documented. ? ? ?Last Vitals:  ?Vitals:  ? 08/05/21 0815 08/05/21 0830  ?BP: (!) 97/45 (!) 105/53  ?Pulse: 68 66  ?Resp: (!) 9 13  ?Temp: (!) 36.4 ?C   ?SpO2: 97% 98%  ?  ?Last Pain:  ?Vitals:  ? 08/05/21 0830  ?TempSrc:   ?PainSc: 0-No pain  ? ? ?  ?  ?  ?  ?  ?  ? ?Arita Miss ? ? ? ? ?

## 2021-08-05 NOTE — Discharge Instructions (Signed)

## 2021-08-08 LAB — SURGICAL PATHOLOGY

## 2021-08-16 ENCOUNTER — Ambulatory Visit (INDEPENDENT_AMBULATORY_CARE_PROVIDER_SITE_OTHER): Payer: 59 | Admitting: Obstetrics and Gynecology

## 2021-08-16 ENCOUNTER — Encounter: Payer: Self-pay | Admitting: Obstetrics and Gynecology

## 2021-08-16 VITALS — BP 119/68 | HR 57 | Resp 16 | Ht 66.0 in | Wt 159.4 lb

## 2021-08-16 DIAGNOSIS — Z4889 Encounter for other specified surgical aftercare: Secondary | ICD-10-CM

## 2021-08-16 DIAGNOSIS — N841 Polyp of cervix uteri: Secondary | ICD-10-CM

## 2021-08-16 DIAGNOSIS — Z9889 Other specified postprocedural states: Secondary | ICD-10-CM

## 2021-08-16 DIAGNOSIS — N939 Abnormal uterine and vaginal bleeding, unspecified: Secondary | ICD-10-CM

## 2021-08-16 MED ORDER — ESTRADIOL 0.1 MG/GM VA CREA: 1.0000 | TOPICAL_CREAM | VAGINAL | 4 refills | Status: DC

## 2021-08-16 NOTE — Progress Notes (Signed)
? ? ?  OBSTETRICS/GYNECOLOGY POST-OPERATIVE CLINIC VISIT ? ?Subjective:  ?  ? Sydney Horne is a 56 y.o. female who presents to the clinic 2 weeks status post Hysteroscopy D&C with polypectomy for  endometrial polyp and postmenopausal bleeding . Eating a regular diet without difficulty. Bowel movements are normal. The patient is not having any pain.  Reports spotting is minimal.  ? ?The following portions of the patient's history were reviewed and updated as appropriate: allergies, current medications, past family history, past medical history, past social history, past surgical history, and problem list. ? ?Review of Systems ?Pertinent items noted in HPI and remainder of comprehensive ROS otherwise negative. ?  ?Objective:  ? ?BP 119/68   Pulse (!) 57   Resp 16   Ht '5\' 6"'$  (1.676 m)   Wt 159 lb 6.4 oz (72.3 kg)   LMP 05/15/2020 (Approximate)   BMI 25.73 kg/m?  Body mass index is 25.73 kg/m?. ? ?General:  alert and no distress  ?Abdomen: soft, bowel sounds active, non-tender  ?Pelvis:   deferred  ? ? ?Pathology:  ?A. ENDOMETRIUM; CURETTAGE:  ?- WEAKLY PROLIFERATIVE ENDOMETRIUM WITH GLANDULAR AND STROMAL BREAKDOWN.  ?- FOCAL CHANGES SUGGESTIVE OF BENIGN ENDOMETRIAL POLYP.  ?- NEGATIVE FOR ATYPICAL HYPERPLASIA/EIN AND MALIGNANCY.  ? ?B. ENDOMETRIUM; POLYPECTOMY:  ?- FRAGMENTS OF DISORDERED PROLIFERATIVE ENDOMETRIUM WITH CHANGES  ?SUGGESTIVE OF BENIGN ENDOMETRIAL POLYP.  ?- NEGATIVE FOR ATYPICAL HYPERPLASIA/EIN AND MALIGNANCY.  ? ?Assessment:  ? ?Patient s/p Hysteroscopy D&C and polypectomy for postmenopausal bleeding ?Doing well postoperatively. ?  ?Plan:  ? ?1. Operative findings reviewed. Pathology report discussed. ?2. Activity restrictions: none.  ?3. Anticipated return to work:  She has already returned to work . ?4. Follow up: as needed for routine preventative care.  ? ? ? ?Rubie Maid, MD ?[facility]  ?

## 2022-02-17 ENCOUNTER — Other Ambulatory Visit: Payer: Self-pay | Admitting: Obstetrics and Gynecology

## 2022-02-17 DIAGNOSIS — Z1231 Encounter for screening mammogram for malignant neoplasm of breast: Secondary | ICD-10-CM

## 2022-02-23 ENCOUNTER — Ambulatory Visit
Admission: RE | Admit: 2022-02-23 | Discharge: 2022-02-23 | Disposition: A | Discharge status: Home / Self Care | Payer: 59 | Source: Ambulatory Visit | Attending: Obstetrics and Gynecology | Admitting: Obstetrics and Gynecology

## 2022-02-23 DIAGNOSIS — Z1231 Encounter for screening mammogram for malignant neoplasm of breast: Secondary | ICD-10-CM | POA: Insufficient documentation

## 2022-03-12 NOTE — Patient Instructions (Incomplete)

## 2022-03-14 ENCOUNTER — Ambulatory Visit (INDEPENDENT_AMBULATORY_CARE_PROVIDER_SITE_OTHER): Payer: 59 | Admitting: Nurse Practitioner

## 2022-03-14 ENCOUNTER — Encounter: Payer: Self-pay | Admitting: Nurse Practitioner

## 2022-03-14 VITALS — BP 108/64 | HR 76 | Temp 98.4°F | Resp 18 | Ht 62.5 in | Wt 154.3 lb

## 2022-03-14 DIAGNOSIS — Z23 Encounter for immunization: Secondary | ICD-10-CM

## 2022-03-14 DIAGNOSIS — R7303 Prediabetes: Secondary | ICD-10-CM | POA: Diagnosis not present

## 2022-03-14 DIAGNOSIS — Z0000 Encounter for general adult medical examination without abnormal findings: Secondary | ICD-10-CM | POA: Diagnosis not present

## 2022-03-14 DIAGNOSIS — Z803 Family history of malignant neoplasm of breast: Secondary | ICD-10-CM

## 2022-03-14 DIAGNOSIS — F4321 Adjustment disorder with depressed mood: Secondary | ICD-10-CM

## 2022-03-14 DIAGNOSIS — G43009 Migraine without aura, not intractable, without status migrainosus: Secondary | ICD-10-CM

## 2022-03-14 DIAGNOSIS — E663 Overweight: Secondary | ICD-10-CM

## 2022-03-14 DIAGNOSIS — E559 Vitamin D deficiency, unspecified: Secondary | ICD-10-CM

## 2022-03-14 LAB — BAYER DCA HB A1C WAIVED: HB A1C (BAYER DCA - WAIVED): 5.7 % — ABNORMAL HIGH (ref 4.8–5.6)

## 2022-03-14 NOTE — Assessment & Plan Note (Signed)
Continue yearly mammograms -- last in October 2023.  She has not had genetic testing, reports whole family has tested negative.

## 2022-03-14 NOTE — Assessment & Plan Note (Signed)
No current medications, monitor closely.  Labs today.

## 2022-03-14 NOTE — Progress Notes (Signed)
Contacted via MyChart   Good morning Sydney Horne.  The A1C is the diabetes testing we talked about, this looks at your blood sugars over the past 3 months and turns the average into a number.  Your number is 5.7%, meaning you are prediabetic.  Any number 5.7 to 6.4 is considered prediabetes and any number 6.5 or greater is considered diabetes.   I would recommend heavy focus on decreasing foods high in sugar and your intake of things like bread products, pasta, and rice.  The American Diabetes Association online has a large amount of information on diet changes to make.  We will recheck this number in 3 months to ensure you are not continuing to trend upwards and move into diabetes.  Have a good day.

## 2022-03-14 NOTE — Assessment & Plan Note (Signed)
BMI 27.77.  Recommended eating smaller high protein, low fat meals more frequently and exercising 30 mins a day 5 times a week with a goal of 10-15lb weight loss in the next 3 months. Patient voiced their understanding and motivation to adhere to these recommendations.

## 2022-03-14 NOTE — Assessment & Plan Note (Signed)
Related to sleep pattern -- recommend increase Melatonin to 10 MG nightly and trial Ashwagandha and Omega 3 supplements as she prefers not to take medications.  Could consider Trazodone in future, which was discussed with her.  Denies SI/HI.

## 2022-03-14 NOTE — Assessment & Plan Note (Signed)
A1c check today, last was 5.4% -- improved.  Recommend continue diet and exercise focus.

## 2022-03-14 NOTE — Assessment & Plan Note (Signed)
Noted on past labs.  Check Vitamin D level today and adjust as needed.

## 2022-03-14 NOTE — Progress Notes (Signed)
BP 108/64 (BP Location: Left Arm, Patient Position: Sitting, Cuff Size: Normal)   Pulse 76   Temp 98.4 F (36.9 C) (Oral)   Resp 18   Ht 5' 2.5" (1.588 m)   Wt 154 lb 4.8 oz (70 kg)   LMP 05/15/2020 (Approximate)   SpO2 97%   BMI 27.77 kg/m    Subjective:    Patient ID: Sydney Horne, female    DOB: 11-Apr-1966, 56 y.o.   MRN: [id number]  HPI: Sydney Horne is a 56 y.o. female presenting on 03/14/2022 for comprehensive medical examination. Current medical complaints include:none  She currently lives with: husband Menopausal Symptoms: no  Last visit in October 2022 5.4%.  She is checking sugars at home fasting in morning -- range in 100.  Has two sisters who are diabetic.    The 10-year ASCVD risk score (Arnett DK, et al., 2019) is: 1.5%   Values used to calculate the score:     Age: 73 years     Sex: Female     Is Non-Hispanic African American: No     Diabetic: No     Tobacco smoker: No     Systolic Blood Pressure: 470 mmHg     Is BP treated: No     HDL Cholesterol: 59 mg/dL     Total Cholesterol: 175 mg/dL   DEPRESSION Does endorse poor sleep pattern, takes Melatonin -- takes 5 MG which often helps her sleep, last night it did not.  Does not wish to start medication at this time. Mood status: stable Psychotherapy/counseling: none Depressed mood: yes Anxious mood: occasional Anhedonia: yes Significant weight loss or gain: no Insomnia: yes hard to fall asleep Fatigue: yes Feelings of worthlessness or guilt: no Impaired concentration/indecisiveness: occasionally if tired Suicidal ideations: no Hopelessness: no Crying spells: no    03/14/2022    8:52 AM 03/14/2021   10:24 AM 03/18/2020    8:04 AM 01/22/2019    8:29 AM 01/18/2018    3:20 PM  Depression screen PHQ 2/9  Decreased Interest 0 0 0 0 0  Down, Depressed, Hopeless 0 0 0 0 0  PHQ - 2 Score 0 0 0 0 0  Altered sleeping _0 0  Tired, decreased energy 2 0  0 0  Change in appetite 0 0  1 0  Feeling bad  or failure about yourself  0 0  0 0  Trouble concentrating 2 0  0 0  Moving slowly or fidgety/restless 0 0  0 0  Suicidal thoughts 0 0  0 0  PHQ-9 Score _1 0  Difficult doing work/chores Somewhat difficult Not difficult at all  Not difficult at all        03/14/2022    8:52 AM 01/22/2019    8:29 AM  GAD 7 : Generalized Anxiety Score  Nervous, Anxious, on Edge 0 0  Control/stop worrying 0 0  Worry too much - different things 1 0  Trouble relaxing 1 0  Restless 0 0  Easily annoyed or irritable 0 0  Afraid - awful might happen 0 0  Total GAD 7 Score 2 0  Anxiety Difficulty Not difficult at all Not difficult at all       03/14/2021   10:24 AM 03/14/2021   10:42 AM 07/07/2021    8:03 AM 08/16/2021    4:41 PM 03/14/2022    8:52 AM  Fall Risk  Falls in the past year? 0 0 0  0 0  Was there an injury with Fall? 0 0 0 0 0  Fall Risk Category Calculator 0 0 0 0 0  Fall Risk Category _0   Patient Fall Risk Level Low fall risk Low fall risk   Low fall risk  Patient at Risk for Falls Due to No Fall Risks No Fall Risks   No Fall Risks  Fall risk Follow up Falls evaluation completed Falls prevention discussed   Falls evaluation completed    Functional Status Survey: Is the patient deaf or have difficulty hearing?: No Does the patient have difficulty seeing, even when wearing glasses/contacts?: No Does the patient have difficulty concentrating, remembering, or making decisions?: No Does the patient have difficulty walking or climbing stairs?: No Does the patient have difficulty dressing or bathing?: No Does the patient have difficulty doing errands alone such as visiting a doctor's office or shopping?: No   Past Medical History:  Past Medical History:  Diagnosis Date   Environmental allergies    Headache    2x/mo   Heart murmur    followed by PCP   History of kidney stones    Neck pain    sees chiropractor for adjustment when having problems   Pre-diabetes     Vertigo 2017   2 episodes    Surgical History:  Past Surgical History:  Procedure Laterality Date   bladder control surgery     TVT, performed in 2015   COLONOSCOPY WITH PROPOFOL N/A 11/27/2016   Procedure: COLONOSCOPY WITH PROPOFOL;  Surgeon: Lucilla Lame, MD;  Location: [facility];  Service: Endoscopy;  Laterality: N/A;   DILATATION & CURETTAGE/HYSTEROSCOPY WITH MYOSURE N/A 08/05/2021   Procedure: DILATATION & CURETTAGE/HYSTEROSCOPY WITH MYOSURE; ENDOMETRIAL POLYPECTOMY;  Surgeon: Rubie Maid, MD;  Location: [facility];  Service: Gynecology;  Laterality: N/A;    Medications:  Current Outpatient Medications on File Prior to Visit  Medication Sig   Blood Glucose Monitoring Suppl (ONETOUCH VERIO) w/Device KIT Use to check blood sugar twice a day, fasting in morning and 2 hours after a meal.  Goals: < 130 fasting in morning and <180 two hours after your eat.   Calcium Carb-Cholecalciferol (CALCIUM + D3 PO) Take 1 tablet by mouth every other day.   Cranberry (THERACRAN ONE PO) Take 1 tablet by mouth daily.   estradiol (ESTRACE) 0.1 MG/GM vaginal cream Place 1 Applicatorful vaginally 2 (two) times a week.   glucose blood (CONTOUR NEXT TEST) test strip Use to check blood sugar twice a day, fasting in morning and 2 hours after a meal.  Goals: < 130 fasting in morning and <180 two hours after your eat.   ibuprofen (ADVIL,MOTRIN) 200 MG tablet Take 400 mg by mouth every 6 (six) hours as needed for headache.   Lancets (ONETOUCH ULTRASOFT) lancets Use to check blood sugar twice a day, fasting in morning and 2 hours after a meal.  Goals: < 130 fasting in morning and <180 two hours after your eat.   Melatonin 5 MG CAPS Take 5 mg by mouth at bedtime as needed (sleep).   Multiple Vitamin (MULTIVITAMIN) tablet Take 1 tablet by mouth daily.   VITAMIN D PO Take 1 capsule by mouth daily.   No current facility-administered medications on file prior to visit.    Allergies:  Allergies  Allergen  Reactions   Other Nausea And Vomiting    sauerkraut   Zithromax [Azithromycin] Diarrhea        Adhesive [Tape] Rash  Band-aids irritate skin    Social History:  Social History   Socioeconomic History   Marital status: Married    Spouse name: Not on file   Number of children: Not on file   Years of education: Not on file   Highest education level: Not on file  Occupational History   Not on file  Tobacco Use   Smoking status: Never   Smokeless tobacco: Never  Vaping Use   Vaping Use: Never used  Substance and Sexual Activity   Alcohol use: Not Currently    Comment: 1 drink/mo   Drug use: No   Sexual activity: Yes    Birth control/protection: None  Other Topics Concern   Not on file  Social History Narrative   Not on file   Social Determinants of Health   Financial Resource Strain: Low Risk  (03/14/2021)   Overall Financial Resource Strain (CARDIA)    Difficulty of Paying Living Expenses: Not hard at all  Food Insecurity: No Food Insecurity (03/14/2021)   Hunger Vital Sign    Worried About Running Out of Food in the Last Year: Never true    [place] in the Last Year: Never true  Transportation Needs: No Transportation Needs (03/14/2021)   PRAPARE - Hydrologist (Medical): No    Lack of Transportation (Non-Medical): No  Physical Activity: Sufficiently Active (03/14/2021)   Exercise Vital Sign    Days of Exercise per Week: 4 days    Minutes of Exercise per Session: 50 min  Stress: No Stress Concern Present (03/14/2021)   [place]    Feeling of Stress : Only a little  Social Connections: Moderately Integrated (03/14/2021)   Social Connection and Isolation Panel [NHANES]    Frequency of Communication with Friends and Family: More than three times a week    Frequency of Social Gatherings with Friends and Family: More than three times a week    Attends Religious  Services: 1 to 4 times per year    Active Member of [employer] or Organizations: No    Attends Archivist Meetings: Never    Marital Status: Married  Human resources officer Violence: Not At Risk (03/14/2021)   Humiliation, Afraid, Rape, and Kick questionnaire    Fear of Current or Ex-Partner: No    Emotionally Abused: No    Physically Abused: No    Sexually Abused: No   Social History   Tobacco Use  Smoking Status Never  Smokeless Tobacco Never   Social History   Substance and Sexual Activity  Alcohol Use Not Currently   Comment: 1 drink/mo    Family History:  Family History  Problem Relation Age of Onset   Breast cancer Mother 75   Cancer Mother        breast   Cancer Father        prostate   Breast cancer Sister 77   Cancer Sister        breast   Diabetes Sister    Colon cancer Sister    Breast cancer Maternal Aunt    Breast cancer Paternal Aunt    Breast cancer Paternal Grandmother    Cancer Paternal Grandmother        breast   Diabetes Maternal Grandmother    Heart attack Maternal Grandfather    Diabetes Sister    Ovarian cancer Neg Hx     Past medical history, surgical history, medications, allergies,  family history and social history reviewed with patient today and changes made to appropriate areas of the chart.   Review of Systems - negative All other ROS negative except what is listed above and in the HPI.      Objective:    BP 108/64 (BP Location: Left Arm, Patient Position: Sitting, Cuff Size: Normal)   Pulse 76   Temp 98.4 F (36.9 C) (Oral)   Resp 18   Ht 5' 2.5" (1.588 m)   Wt 154 lb 4.8 oz (70 kg)   LMP 05/15/2020 (Approximate)   SpO2 97%   BMI 27.77 kg/m   Wt Readings from Last 3 Encounters:  03/14/22 154 lb 4.8 oz (70 kg)  08/16/21 159 lb 6.4 oz (72.3 kg)  07/29/21 163 lb 8 oz (74.2 kg)    Physical Exam Vitals and nursing note reviewed.  Constitutional:      General: She is awake. She is not in acute distress.    Appearance:  She is well-developed, well-groomed and overweight. She is not ill-appearing.  HENT:     Head: Normocephalic.     Right Ear: Hearing, tympanic membrane, ear canal and external ear normal. No drainage.     Left Ear: Hearing, tympanic membrane, ear canal and external ear normal. No drainage.     Nose: Nose normal.     Mouth/Throat:     Mouth: Mucous membranes are moist.     Pharynx: Uvula midline.  Eyes:     General: Lids are normal.        Right eye: No discharge.        Left eye: No discharge.     Extraocular Movements: Extraocular movements intact.     Conjunctiva/sclera: Conjunctivae normal.     Pupils: Pupils are equal, round, and reactive to light.     Visual Fields: Right eye visual fields normal and left eye visual fields normal.  Neck:     Thyroid: No thyromegaly.     Vascular: No carotid bruit.  Cardiovascular:     Rate and Rhythm: Normal rate and regular rhythm.     Heart sounds: Normal heart sounds. No murmur heard.    No gallop.  Pulmonary:     Effort: Pulmonary effort is normal. No accessory muscle usage or respiratory distress.     Breath sounds: Normal breath sounds.  Chest:  Breasts:    Right: Normal.     Left: Normal.  Abdominal:     General: Bowel sounds are normal.     Palpations: Abdomen is soft. There is no hepatomegaly or splenomegaly.     Tenderness: There is no abdominal tenderness.  Musculoskeletal:     Cervical back: Normal range of motion and neck supple.     Right lower leg: No edema.     Left lower leg: No edema.  Lymphadenopathy:     Head:     Right side of head: No submental, submandibular, tonsillar, preauricular or posterior auricular adenopathy.     Left side of head: No submental, submandibular, tonsillar, preauricular or posterior auricular adenopathy.     Cervical: No cervical adenopathy.     Upper Body:     Right upper body: No supraclavicular, axillary or pectoral adenopathy.     Left upper body: No supraclavicular, axillary or  pectoral adenopathy.  Skin:    General: Skin is warm and dry.  Neurological:     Mental Status: She is alert and oriented to person, place, and time.  Gait: Gait is intact.     Deep Tendon Reflexes: Reflexes are normal and symmetric.     Reflex Scores:      Brachioradialis reflexes are 2+ on the right side and 2+ on the left side.      Patellar reflexes are 2+ on the right side and 2+ on the left side. Psychiatric:        Attention and Perception: Attention normal.        Mood and Affect: Mood normal.        Speech: Speech normal.        Behavior: Behavior normal. Behavior is cooperative.        Thought Content: Thought content normal.        Judgment: Judgment normal.    Results for orders placed or performed during the hospital encounter of 08/05/21  Surgical pathology  Result Value Ref Range   SURGICAL PATHOLOGY      SURGICAL PATHOLOGY CASE: [id number] PATIENT: Kenlyn Dillow Surgical Pathology Report     Specimen Submitted: A. Endometrial curettings B. Endometrial polyp  Clinical History: Postmenopausal bleeding, endometrial polyp    DIAGNOSIS: A. ENDOMETRIUM; CURETTAGE: - WEAKLY PROLIFERATIVE ENDOMETRIUM WITH GLANDULAR AND STROMAL BREAKDOWN. - FOCAL CHANGES SUGGESTIVE OF BENIGN ENDOMETRIAL POLYP. - NEGATIVE FOR ATYPICAL HYPERPLASIA/EIN AND MALIGNANCY.  B. ENDOMETRIUM; POLYPECTOMY: - FRAGMENTS OF DISORDERED PROLIFERATIVE ENDOMETRIUM WITH CHANGES SUGGESTIVE OF BENIGN ENDOMETRIAL POLYP. - NEGATIVE FOR ATYPICAL HYPERPLASIA/EIN AND MALIGNANCY.  GROSS DESCRIPTION: A. Labeled: Endometrial curettings Received: Fresh Collection time: 8:00 AM on 08/05/2021 Placed into formalin time: 8:37 AM on 08/05/2021 Tissue fragment(s): Multiple Size: Aggregate, 2.4 x 0.5 x 0.1 cm Description: Received on a Telfa pad are pink, hemorrhagic soft tissue fragments Entirely submitted in 1 c assette.  B. Labeled: Endometrial polyp Received: Fresh Collection time: 8:00 AM on  08/05/2021 Placed into formalin time: 8:37 AM on 08/05/2021 Tissue fragment(s): Multiple Size: Aggregate, 2.0 x 1.0 x 0.2 cm Description: Received are pink soft tissue fragments and blood clot. Entirely submitted in 1 cassette.  CM 08/05/2021.  Final Diagnosis performed by Allena Napoleon, MD.   Electronically signed 08/08/2021 9:31:51AM The electronic signature indicates that the named Attending Pathologist has evaluated the specimen Technical component performed at [facility], [address], [place], [place] [address] Lab: [phone] Dir: Rush Farmer, MD, MMM  Professional component performed at [facility], [facility], [place], [place], [place] [address] Lab: [phone] Dir: Kathi Simpers, MD       Assessment & Plan:   Problem List Items Addressed This Visit       Cardiovascular and Mediastinum   Migraines - Primary    No current medications, monitor closely.  Labs today.      Relevant Orders   CBC with Differential/Platelet     Other   Family history of breast cancer in first degree relative    Continue yearly mammograms -- last in October 2023.  She has not had genetic testing, reports whole family has tested negative.      Overweight    BMI 27.77.  Recommended eating smaller high protein, low fat meals more frequently and exercising 30 mins a day 5 times a week with a goal of 10-15lb weight loss in the next 3 months. Patient voiced their understanding and motivation to adhere to these recommendations.       Prediabetes    A1c check today, last was 5.4% -- improved.  Recommend continue diet and exercise focus.        Relevant  Orders   Bayer DCA Hb A1c Waived   Comprehensive metabolic panel   TSH   Lipid Panel w/o Chol/HDL Ratio   Situational depression    Related to sleep pattern -- recommend increase Melatonin to 10 MG nightly and trial Ashwagandha and Omega 3 supplements as she prefers not to take medications.  Could consider Trazodone  in future, which was discussed with her.  Denies SI/HI.      Vitamin D deficiency    Noted on past labs.  Check Vitamin D level today and adjust as needed.      Relevant Orders   VITAMIN D 25 Hydroxy (Vit-D Deficiency, Fractures)   Other Visit Diagnoses     Encounter for annual physical exam       Annual physical today with labs and health maintenance reviewed, discussed with patient.        Follow up plan: Return in about 1 year (around 03/15/2023) for Annual Physical.   LABORATORY TESTING:  - Pap smear: up to date  IMMUNIZATIONS:   - Tdap: Tetanus vaccination status reviewed: last tetanus booster within 10 years. - Influenza: Had on 02/28/22 - Pneumovax: Not applicable - Prevnar: Not applicable - HPV: Not applicable - Zostavax vaccine:Up To Date  SCREENING: -Mammogram: Up to date  - Colonoscopy: Up to date  - Bone Density: Not applicable  -Hearing Test: Not applicable  -Spirometry: Not applicable   PATIENT COUNSELING:   Advised to take 1 mg of folate supplement per day if capable of pregnancy.   Sexuality: Discussed sexually transmitted diseases, partner selection, use of condoms, avoidance of unintended pregnancy  and contraceptive alternatives.   Advised to avoid cigarette smoking.  I discussed with the patient that most people either abstain from alcohol or drink within safe limits (<=14/week and <=4 drinks/occasion for males, <=7/weeks and <= 3 drinks/occasion for females) and that the risk for alcohol disorders and other health effects rises proportionally with the number of drinks per week and how often a drinker exceeds daily limits.  Discussed cessation/primary prevention of drug use and availability of treatment for abuse.   Diet: Encouraged to adjust caloric intake to maintain  or achieve ideal body weight, to reduce intake of dietary saturated fat and total fat, to limit sodium intake by avoiding high sodium foods and not adding table salt, and to  maintain adequate dietary potassium and calcium preferably from fresh fruits, vegetables, and low-fat dairy products.    Stressed the importance of regular exercise  Injury prevention: Discussed safety belts, safety helmets, smoke detector, smoking near bedding or upholstery.   Dental health: Discussed importance of regular tooth brushing, flossing, and dental visits.    NEXT PREVENTATIVE PHYSICAL DUE IN 1 YEAR. Return in about 1 year (around 03/15/2023) for Annual Physical.

## 2022-03-15 ENCOUNTER — Telehealth: Payer: Self-pay | Admitting: Obstetrics and Gynecology

## 2022-03-15 LAB — CBC WITH DIFFERENTIAL/PLATELET
Basophils Absolute: 0 10*3/uL (ref 0.0–0.2)
Basos: 0 %
EOS (ABSOLUTE): 0 10*3/uL (ref 0.0–0.4)
Eos: 0 %
Hematocrit: 43.1 % (ref 34.0–46.6)
Hemoglobin: 14.2 g/dL (ref 11.1–15.9)
Immature Grans (Abs): 0 10*3/uL (ref 0.0–0.1)
Immature Granulocytes: 0 %
Lymphocytes Absolute: 1.4 10*3/uL (ref 0.7–3.1)
Lymphs: 20 %
MCH: 30.7 pg (ref 26.6–33.0)
MCHC: 32.9 g/dL (ref 31.5–35.7)
MCV: 93 fL (ref 79–97)
Monocytes Absolute: 0.5 10*3/uL (ref 0.1–0.9)
Monocytes: 8 %
Neutrophils Absolute: 4.7 10*3/uL (ref 1.4–7.0)
Neutrophils: 72 %
Platelets: 204 10*3/uL (ref 150–450)
RBC: 4.62 x10E6/uL (ref 3.77–5.28)
RDW: 13.5 % (ref 11.7–15.4)
WBC: 6.7 10*3/uL (ref 3.4–10.8)

## 2022-03-15 LAB — COMPREHENSIVE METABOLIC PANEL
ALT: 15 IU/L (ref 0–32)
AST: 22 IU/L (ref 0–40)
Albumin/Globulin Ratio: 2.7 — ABNORMAL HIGH (ref 1.2–2.2)
Albumin: 4.8 g/dL (ref 3.8–4.9)
Alkaline Phosphatase: 74 IU/L (ref 44–121)
BUN/Creatinine Ratio: 24 — ABNORMAL HIGH (ref 9–23)
BUN: 22 mg/dL (ref 6–24)
Bilirubin Total: 0.4 mg/dL (ref 0.0–1.2)
CO2: 23 mmol/L (ref 20–29)
Calcium: 10 mg/dL (ref 8.7–10.2)
Chloride: 102 mmol/L (ref 96–106)
Creatinine, Ser: 0.91 mg/dL (ref 0.57–1.00)
Globulin, Total: 1.8 g/dL (ref 1.5–4.5)
Glucose: 93 mg/dL (ref 70–99)
Potassium: 4.5 mmol/L (ref 3.5–5.2)
Sodium: 140 mmol/L (ref 134–144)
Total Protein: 6.6 g/dL (ref 6.0–8.5)
eGFR: 74 mL/min/{1.73_m2} (ref >59)

## 2022-03-15 LAB — LIPID PANEL W/O CHOL/HDL RATIO
Cholesterol, Total: 164 mg/dL (ref 100–199)
HDL: 70 mg/dL (ref >39)
LDL Chol Calc (NIH): 81 mg/dL (ref 0–99)
Triglycerides: 65 mg/dL (ref 0–149)
VLDL Cholesterol Cal: 13 mg/dL (ref 5–40)

## 2022-03-15 LAB — VITAMIN D 25 HYDROXY (VIT D DEFICIENCY, FRACTURES): Vit D, 25-Hydroxy: 47.9 ng/mL (ref 30.0–100.0)

## 2022-03-15 LAB — TSH: TSH: 2.11 u[IU]/mL (ref 0.450–4.500)

## 2022-03-15 NOTE — Progress Notes (Signed)
Contacted via MyChart   Good morning Sydney Horne, your labs have returned and overall they are stable.  No changes needed.  Cholesterol labs remain stable:)  Any questions? Keep being stellar!!  Thank you for allowing me to participate in your care.  I appreciate you. Kindest regards, Rika Daughdrill

## 2022-03-22 NOTE — Telephone Encounter (Signed)
I contacted patient via phone. I left voicemail for patient to call back to be scheduled.   

## 2022-04-19 ENCOUNTER — Ambulatory Visit: Payer: 59 | Admitting: Obstetrics and Gynecology

## 2022-05-10 ENCOUNTER — Ambulatory Visit (INDEPENDENT_AMBULATORY_CARE_PROVIDER_SITE_OTHER): Payer: 59 | Admitting: Obstetrics and Gynecology

## 2022-05-10 ENCOUNTER — Encounter: Payer: Self-pay | Admitting: Obstetrics and Gynecology

## 2022-05-10 VITALS — BP 113/56 | HR 58 | Ht 66.0 in | Wt 162.0 lb

## 2022-05-10 DIAGNOSIS — N952 Postmenopausal atrophic vaginitis: Secondary | ICD-10-CM

## 2022-05-10 DIAGNOSIS — R7303 Prediabetes: Secondary | ICD-10-CM | POA: Diagnosis not present

## 2022-05-10 DIAGNOSIS — Z01419 Encounter for gynecological examination (general) (routine) without abnormal findings: Secondary | ICD-10-CM

## 2022-05-10 DIAGNOSIS — Z803 Family history of malignant neoplasm of breast: Secondary | ICD-10-CM

## 2022-05-10 DIAGNOSIS — Z800 Family history of malignant neoplasm of digestive organs: Secondary | ICD-10-CM

## 2022-05-10 NOTE — Progress Notes (Signed)
ANNUAL PREVENTATIVE CARE GYNECOLOGY  ENCOUNTER NOTE  Subjective:       Sydney Horne is a 56 y.o. G29P2002 female here for a routine annual gynecologic exam. The patient is not sexually active. The patient is not taking hormone replacement therapy. Patient denies post-menopausal vaginal bleeding. The patient wears seatbelts: yes. The patient participates in regular exercise: yes 5 days a week . Has the patient ever been transfused or tattooed?: no. The patient reports that there is not domestic violence in her life.  Current complaints: 1.  None.     Gynecologic History Patient's last menstrual period was 05/15/2020 (approximate). Contraception: postmenopausal Last Pap: 11/22/2018. Results were: normal Last mammogram: 02/23/2022. Results were: normal Last Colonoscopy: 11/27/2016. Results were normal.     Obstetric History OB History  Gravida Para Term Preterm AB Living  _0 SAB IAB Ectopic Multiple Live Births          2    # Outcome Date GA Lbr Len/2nd Weight Sex Delivery Anes PTL Lv  2 Term 1999   8 lb 12.8 oz (3.992 kg) M Vag-Spont   LIV  1 Term 1997   8 lb 1.6 oz (3.674 kg) F Vag-Spont   LIV    Past Medical History:  Diagnosis Date   Environmental allergies    Headache    2x/mo   Heart murmur    followed by PCP   History of kidney stones    Neck pain    sees chiropractor for adjustment when having problems   Pre-diabetes    Vertigo 2017   2 episodes    Family History  Problem Relation Age of Onset   Breast cancer Mother 71   Cancer Mother        breast   Cancer Father        prostate   Breast cancer Sister 32   Cancer Sister        breast   Diabetes Sister    Colon cancer Sister    Breast cancer Maternal Aunt    Breast cancer Paternal Aunt    Breast cancer Paternal Grandmother    Cancer Paternal Grandmother        breast   Diabetes Maternal Grandmother    Heart attack Maternal Grandfather    Diabetes Sister    Ovarian cancer Neg Hx      Past Surgical History:  Procedure Laterality Date   bladder control surgery     TVT, performed in 2015   COLONOSCOPY WITH PROPOFOL N/A 11/27/2016   Procedure: COLONOSCOPY WITH PROPOFOL;  Surgeon: Lucilla Lame, MD;  Location: [place];  Service: Endoscopy;  Laterality: N/A;   DILATATION & CURETTAGE/HYSTEROSCOPY WITH MYOSURE N/A 08/05/2021   Procedure: DILATATION & CURETTAGE/HYSTEROSCOPY WITH MYOSURE; ENDOMETRIAL POLYPECTOMY;  Surgeon: Rubie Maid, MD;  Location: [facility];  Service: Gynecology;  Laterality: N/A;    Social History   Socioeconomic History   Marital status: Married    Spouse name: Not on file   Number of children: Not on file   Years of education: Not on file   Highest education level: Not on file  Occupational History   Not on file  Tobacco Use   Smoking status: Never   Smokeless tobacco: Never  Vaping Use   Vaping Use: Never used  Substance and Sexual Activity   Alcohol use: Not Currently    Comment: 1 drink/mo   Drug use: No   Sexual activity:  Yes    Birth control/protection: None  Other Topics Concern   Not on file  Social History Narrative   Not on file   Social Determinants of Health   Financial Resource Strain: Low Risk  (03/14/2021)   Overall Financial Resource Strain (CARDIA)    Difficulty of Paying Living Expenses: Not hard at all  Food Insecurity: No Food Insecurity (03/14/2021)   Hunger Vital Sign    Worried About Running Out of Food in the Last Year: Never true    Ran Out of Food in the Last Year: Never true  Transportation Needs: No Transportation Needs (03/14/2021)   PRAPARE - Hydrologist (Medical): No    Lack of Transportation (Non-Medical): No  Physical Activity: Sufficiently Active (03/14/2021)   Exercise Vital Sign    Days of Exercise per Week: 4 days    Minutes of Exercise per Session: 50 min  Stress: No Stress Concern Present (03/14/2021)   [place]    Feeling of Stress : Only a little  Social Connections: Moderately Integrated (03/14/2021)   Social Connection and Isolation Panel [NHANES]    Frequency of Communication with Friends and Family: More than three times a week    Frequency of Social Gatherings with Friends and Family: More than three times a week    Attends Religious Services: 1 to 4 times per year    Active Member of [employer] or Organizations: No    Attends Archivist Meetings: Never    Marital Status: Married  Human resources officer Violence: Not At Risk (03/14/2021)   Humiliation, Afraid, Rape, and Kick questionnaire    Fear of Current or Ex-Partner: No    Emotionally Abused: No    Physically Abused: No    Sexually Abused: No    Current Outpatient Medications on File Prior to Visit  Medication Sig Dispense Refill   Blood Glucose Monitoring Suppl (ONETOUCH VERIO) w/Device KIT Use to check blood sugar twice a day, fasting in morning and 2 hours after a meal.  Goals: < 130 fasting in morning and <180 two hours after your eat. 1 kit 0   Calcium Carb-Cholecalciferol (CALCIUM + D3 PO) Take 1 tablet by mouth every other day.     Cranberry (THERACRAN ONE PO) Take 1 tablet by mouth daily.     estradiol (ESTRACE) 0.1 MG/GM vaginal cream INSERT 1 APPLICATORFUL VAGINALLY TWICE WEEKLY 85 g 3   glucose blood (CONTOUR NEXT TEST) test strip Use to check blood sugar twice a day, fasting in morning and 2 hours after a meal.  Goals: < 130 fasting in morning and <180 two hours after your eat. 100 each 12   ibuprofen (ADVIL,MOTRIN) 200 MG tablet Take 400 mg by mouth every 6 (six) hours as needed for headache.     Lancets (ONETOUCH ULTRASOFT) lancets Use to check blood sugar twice a day, fasting in morning and 2 hours after a meal.  Goals: < 130 fasting in morning and <180 two hours after your eat. 100 each 12   Melatonin 5 MG CAPS Take 5 mg by mouth at bedtime as needed (sleep).     Multiple Vitamin  (MULTIVITAMIN) tablet Take 1 tablet by mouth daily.     VITAMIN D PO Take 1 capsule by mouth daily.     No current facility-administered medications on file prior to visit.    Allergies  Allergen Reactions   Other Nausea And Vomiting  sauerkraut   Zithromax [Azithromycin] Diarrhea        Adhesive [Tape] Rash    Band-aids irritate skin      Review of Systems ROS Review of Systems - General ROS: negative for - chills, fatigue, fever, hot flashes, night sweats, weight gain or weight loss Psychological ROS: negative for - anxiety, decreased libido, depression, mood swings, physical abuse or sexual abuse Ophthalmic ROS: negative for - blurry vision, eye pain or loss of vision ENT ROS: negative for - headaches, hearing change, visual changes or vocal changes Allergy and Immunology ROS: negative for - hives, itchy/watery eyes or seasonal allergies Hematological and Lymphatic ROS: negative for - bleeding problems, bruising, swollen lymph nodes or weight loss Endocrine ROS: negative for - galactorrhea, hair pattern changes, hot flashes, malaise/lethargy, mood swings, palpitations, polydipsia/polyuria, skin changes, temperature intolerance or unexpected weight changes Breast ROS: negative for - new or changing breast lumps or nipple discharge Respiratory ROS: negative for - cough or shortness of breath Cardiovascular ROS: negative for - chest pain, irregular heartbeat, palpitations or shortness of breath Gastrointestinal ROS: no abdominal pain, change in bowel habits, or black or bloody stools Genito-Urinary ROS: no dysuria, trouble voiding, or hematuria Musculoskeletal ROS: negative for - joint pain or joint stiffness Neurological ROS: negative for - bowel and bladder control changes Dermatological ROS: negative for rash and skin lesion changes   Objective:   BP (!) 113/56   Pulse (!) 58   Ht _0  (1.676 m)   Wt 162 lb (73.5 kg)   LMP 05/15/2020 (Approximate)   BMI 26.15 kg/m   CONSTITUTIONAL: Well-developed, well-nourished female in no acute distress.  PSYCHIATRIC: Normal mood and affect. Normal behavior. Normal judgment and thought content. [place]: Alert and oriented to person, place, and time. Normal muscle tone coordination. No cranial nerve deficit noted. HENT:  Normocephalic, atraumatic, External right and left ear normal. Oropharynx is clear and moist EYES: Conjunctivae and EOM are normal. Pupils are equal, round, and reactive to light. No scleral icterus.  NECK: Normal range of motion, supple, no masses.  Normal thyroid.  SKIN: Skin is warm and dry. No rash noted. Not diaphoretic. No erythema. No pallor. CARDIOVASCULAR: Normal heart rate noted, regular rhythm, no murmur. RESPIRATORY: Clear to auscultation bilaterally. Effort and breath sounds normal, no problems with respiration noted. BREASTS: Symmetric in size. No masses, skin changes, nipple drainage, or lymphadenopathy. ABDOMEN: Soft, normal bowel sounds, no distention noted.  No tenderness, rebound or guarding.  BLADDER: Normal PELVIC:  Bladder no bladder distension noted  Urethra: normal appearing urethra with no masses, tenderness or lesions  Vulva: normal appearing vulva with no masses, tenderness or lesions  Vagina: normal appearing vagina with normal color and discharge, no lesions. Moderate amount of white cream in vaginal vault.   Cervix: normal appearing cervix without discharge or lesions  Uterus: uterus is normal size, shape, consistency and nontender  Adnexa: normal adnexa in size, nontender and no masses  RV: External Exam NormaI, No Rectal Masses, and Normal Sphincter tone  MUSCULOSKELETAL: Normal range of motion. No tenderness.  No cyanosis, clubbing, or edema.  2+ distal pulses. LYMPHATIC: No Axillary, Supraclavicular, or Inguinal Adenopathy.   Labs: Lab Results  Component Value Date   WBC 6.7 03/14/2022   HGB 14.2 03/14/2022   HCT 43.1 03/14/2022   MCV 93 03/14/2022   PLT  204 03/14/2022    Lab Results  Component Value Date   CREATININE 0.91 03/14/2022   BUN 22 03/14/2022   NA 140  03/14/2022   K 4.5 03/14/2022   CL 102 03/14/2022   CO2 23 03/14/2022    Lab Results  Component Value Date   ALT 15 03/14/2022   AST 22 03/14/2022   ALKPHOS 74 03/14/2022   BILITOT 0.4 03/14/2022    Lab Results  Component Value Date   CHOL 164 03/14/2022   HDL 70 03/14/2022   LDLCALC 81 03/14/2022   TRIG 65 03/14/2022   CHOLHDL 2.7 01/18/2018    Lab Results  Component Value Date   TSH 2.110 03/14/2022    Lab Results  Component Value Date   HGBA1C 5.7 (H) 03/14/2022     Assessment:   1. Well woman exam with routine gynecological exam   2. Family history of colon cancer   3. Family history of breast cancer in first degree relative   4. Prediabetes   5. Vaginal atrophy      Plan:  - Pap: Pap Co Test - Mammogram:  up to date - Colon Screening:   Up to date - Labs:  None ordered. Up to date, performed by PCP.  - Routine preventative health maintenance measures emphasized: Exercise/Diet/Weight control, Tobacco Warnings, Alcohol/Substance use risks, and Stress Management. Encourage Calcium and Vitamin D intake.  - COVID Vaccination status: Eligible for Dose #4.  - Flu vaccine up to date.  - Prediabetes managed by PCP - Patient with family history of breast and colon cancer, encouraged routine screenings.  - Vaginal atrophy, minimal symptoms at this time. Patient notes that she forgets to use the cream. Advised that she can use as needed.  - Return to [place], MD [place] [facility] [facility] at [facility]

## 2022-05-17 LAB — IGP, APTIMA HPV: HPV Aptima: NEGATIVE

## 2022-10-09 ENCOUNTER — Other Ambulatory Visit: Payer: Self-pay | Admitting: Nurse Practitioner

## 2022-10-11 NOTE — Telephone Encounter (Signed)
Requested Prescriptions  Pending Prescriptions Disp Refills   glucose blood (CONTOUR NEXT TEST) test strip [Pharmacy Med Name: Contour Next Test In Vitro Strip] 100 strip 12    Sig: USE TO CHECK BLOOD SUGAR TWICE A DAY, FASTING IN MORNING AND 2  HOURS AFTER A MEAL. GOALS: &lt; 130 FASTING IN MORNING AND &lt;180 TWO  HOURS AFTER YOUR EAT.     Endocrinology: Diabetes - Testing Supplies Passed - 10/09/2022  1:00 PM      Passed - Valid encounter within last 12 months    Recent Outpatient Visits           7 months ago Migraine without aura and without status migrainosus, not intractable   [place] [facility] [place], Corrie Dandy T, NP   1 year ago Prediabetes   [place] [facility] [place], Corrie Dandy T, NP   1 year ago Neck pain   [place] [facility] [place], Jake Church, NP   1 year ago COVID-19   [place] [facility] Larae Grooms, NP   2 years ago Encounter for annual physical exam   [place] [facility] Marjie Skiff, NP       Future Appointments             In 5 months [place], Dorie Rank, NP [place] [facility]

## 2023-03-08 ENCOUNTER — Other Ambulatory Visit: Payer: Self-pay | Admitting: Obstetrics and Gynecology

## 2023-03-08 DIAGNOSIS — Z1231 Encounter for screening mammogram for malignant neoplasm of breast: Secondary | ICD-10-CM

## 2023-03-16 ENCOUNTER — Encounter: Payer: 59 | Admitting: Nurse Practitioner

## 2023-03-22 ENCOUNTER — Ambulatory Visit
Admission: RE | Admit: 2023-03-22 | Discharge: 2023-03-22 | Disposition: A | Discharge status: Home / Self Care | Payer: 59 | Source: Ambulatory Visit | Attending: Obstetrics and Gynecology | Admitting: Obstetrics and Gynecology

## 2023-03-22 DIAGNOSIS — Z1231 Encounter for screening mammogram for malignant neoplasm of breast: Secondary | ICD-10-CM | POA: Insufficient documentation

## 2023-04-13 NOTE — Patient Instructions (Signed)
Prediabetes Eating Plan Prediabetes is a condition that causes blood sugar (glucose) levels to be higher than normal. This increases the risk for developing type 2 diabetes (type 2 diabetes mellitus). Working with a health care provider or nutrition specialist (dietitian) to make diet and lifestyle changes can help prevent the onset of diabetes. These changes may help you: Control your blood glucose levels. Improve your cholesterol levels. Manage your blood pressure. What are tips for following this plan? Reading food labels Read food labels to check the amount of fat, salt (sodium), and sugar in prepackaged foods. Avoid foods that have: Saturated fats. Trans fats. Added sugars. Avoid foods that have more than 300 milligrams (mg) of sodium per serving. Limit your sodium intake to less than 2,300 mg each day. Shopping Avoid buying pre-made and processed foods. Avoid buying drinks with added sugar. Cooking Cook with olive oil. Do not use butter, lard, or ghee. Bake, broil, grill, steam, or boil foods. Avoid frying. Meal planning  Work with your dietitian to create an eating plan that is right for you. This may include tracking how many calories you take in each day. Use a food diary, notebook, or mobile application to track what you eat at each meal. Consider following a Mediterranean diet. This includes: Eating several servings of fresh fruits and vegetables each day. Eating fish at least twice a week. Eating one serving each day of whole grains, beans, nuts, and seeds. Using olive oil instead of other fats. Limiting alcohol. Limiting red meat. Using nonfat or low-fat dairy products. Consider following a plant-based diet. This includes dietary choices that focus on eating mostly vegetables and fruit, grains, beans, nuts, and seeds. If you have high blood pressure, you may need to limit your sodium intake or follow a diet such as the DASH (Dietary Approaches to Stop Hypertension) eating  plan. The DASH diet aims to lower high blood pressure. Lifestyle Set weight loss goals with help from your health care team. It is recommended that most people with prediabetes lose 7% of their body weight. Exercise for at least 30 minutes 5 or more days a week. Attend a support group or seek support from a mental health counselor. Take over-the-counter and prescription medicines only as told by your health care provider. What foods are recommended? Fruits Berries. Bananas. Apples. Oranges. Grapes. Papaya. Mango. Pomegranate. Kiwi. Grapefruit. Cherries. Vegetables Lettuce. Spinach. Peas. Beets. Cauliflower. Cabbage. Broccoli. Carrots. Tomatoes. Squash. Eggplant. Herbs. Peppers. Onions. Cucumbers. Brussels sprouts. Grains Whole grains, such as whole-wheat or whole-grain breads, crackers, cereals, and pasta. Unsweetened oatmeal. Bulgur. Barley. Quinoa. Brown rice. Corn or whole-wheat flour tortillas or taco shells. Meats and other proteins Seafood. Poultry without skin. Lean cuts of pork and beef. Tofu. Eggs. Nuts. Beans. Dairy Low-fat or fat-free dairy products, such as yogurt, cottage cheese, and cheese. Beverages Water. Tea. Coffee. Sugar-free or diet soda. Seltzer water. Low-fat or nonfat milk. Milk alternatives, such as soy or almond milk. Fats and oils Olive oil. Canola oil. Sunflower oil. Grapeseed oil. Avocado. Walnuts. Sweets and desserts Sugar-free or low-fat pudding. Sugar-free or low-fat ice cream and other frozen treats. Seasonings and condiments Herbs. Sodium-free spices. Mustard. Relish. Low-salt, low-sugar ketchup. Low-salt, low-sugar barbecue sauce. Low-fat or fat-free mayonnaise. The items listed above may not be a complete list of recommended foods and beverages. Contact a dietitian for more information. What foods are not recommended? Fruits Fruits canned with syrup. Vegetables Canned vegetables. Frozen vegetables with butter or cream sauce. Grains Refined white  flour and flour   products, such as bread, pasta, snack foods, and cereals. Meats and other proteins Fatty cuts of meat. Poultry with skin. Breaded or fried meat. Processed meats. Dairy Full-fat yogurt, cheese, or milk. Beverages Sweetened drinks, such as iced tea and soda. Fats and oils Butter. Lard. Ghee. Sweets and desserts Baked goods, such as cake, cupcakes, pastries, cookies, and cheesecake. Seasonings and condiments Spice mixes with added salt. Ketchup. Barbecue sauce. Mayonnaise. The items listed above may not be a complete list of foods and beverages that are not recommended. Contact a dietitian for more information. Where to find more information American Diabetes Association: www.diabetes.org Summary You may need to make diet and lifestyle changes to help prevent the onset of diabetes. These changes can help you control blood sugar, improve cholesterol levels, and manage blood pressure. Set weight loss goals with help from your health care team. It is recommended that most people with prediabetes lose 7% of their body weight. Consider following a Mediterranean diet. This includes eating plenty of fresh fruits and vegetables, whole grains, beans, nuts, seeds, fish, and low-fat dairy, and using olive oil instead of other fats. This information is not intended to replace advice given to you by your health care provider. Make sure you discuss any questions you have with your health care provider. Document Revised: 07/31/2019 Document Reviewed: 07/31/2019 Elsevier Patient Education  2024 Elsevier Inc.  

## 2023-04-16 ENCOUNTER — Ambulatory Visit (INDEPENDENT_AMBULATORY_CARE_PROVIDER_SITE_OTHER): Payer: 59 | Admitting: Nurse Practitioner

## 2023-04-16 ENCOUNTER — Encounter: Payer: Self-pay | Admitting: Nurse Practitioner

## 2023-04-16 VITALS — BP 89/57 | HR 52 | Temp 98.3°F | Ht 66.0 in | Wt 163.8 lb

## 2023-04-16 DIAGNOSIS — R3915 Urgency of urination: Secondary | ICD-10-CM

## 2023-04-16 DIAGNOSIS — Z136 Encounter for screening for cardiovascular disorders: Secondary | ICD-10-CM | POA: Diagnosis not present

## 2023-04-16 DIAGNOSIS — R7303 Prediabetes: Secondary | ICD-10-CM

## 2023-04-16 DIAGNOSIS — Z0000 Encounter for general adult medical examination without abnormal findings: Secondary | ICD-10-CM

## 2023-04-16 DIAGNOSIS — E559 Vitamin D deficiency, unspecified: Secondary | ICD-10-CM | POA: Diagnosis not present

## 2023-04-16 DIAGNOSIS — Z23 Encounter for immunization: Secondary | ICD-10-CM

## 2023-04-16 DIAGNOSIS — F4321 Adjustment disorder with depressed mood: Secondary | ICD-10-CM

## 2023-04-16 DIAGNOSIS — G43009 Migraine without aura, not intractable, without status migrainosus: Secondary | ICD-10-CM | POA: Diagnosis not present

## 2023-04-16 DIAGNOSIS — Z13220 Encounter for screening for lipoid disorders: Secondary | ICD-10-CM | POA: Diagnosis not present

## 2023-04-16 DIAGNOSIS — E663 Overweight: Secondary | ICD-10-CM

## 2023-04-16 LAB — URINALYSIS, ROUTINE W REFLEX MICROSCOPIC
Bilirubin, UA: NEGATIVE
Glucose, UA: NEGATIVE
Ketones, UA: NEGATIVE
Nitrite, UA: NEGATIVE
Protein,UA: NEGATIVE
Specific Gravity, UA: 1.015 (ref 1.005–1.030)
Urobilinogen, Ur: 0.2 mg/dL (ref 0.2–1.0)
pH, UA: 7 (ref 5.0–7.5)

## 2023-04-16 LAB — MICROSCOPIC EXAMINATION: Bacteria, UA: NONE SEEN

## 2023-04-16 LAB — WET PREP FOR TRICH, YEAST, CLUE
Clue Cell Exam: POSITIVE — AB
Trichomonas Exam: NEGATIVE
Yeast Exam: NEGATIVE

## 2023-04-16 LAB — BAYER DCA HB A1C WAIVED: HB A1C (BAYER DCA - WAIVED): 5.5 % (ref 4.8–5.6)

## 2023-04-16 MED ORDER — METRONIDAZOLE 500 MG PO TABS: 500.0000 mg | ORAL_TABLET | Freq: Two times a day (BID) | ORAL | 0 refills | Status: AC

## 2023-04-16 NOTE — Assessment & Plan Note (Signed)
A1c check today, A1c 5.5% today -- improved.  Recommend continue diet and exercise focus.

## 2023-04-16 NOTE — Progress Notes (Signed)
Contacted via MyChart   Good evening Sydney Horne, your labs returned.  Does not look like a urine infection is present, but will send for culture. However, as we discussed and I was concerned for, you do have bacterial vaginosis.  This can explain some of your symptoms.  I will send in Flagyl to take twice a day for 7 days.  No alcohol use while taking this and I recommend taking with food and taking probiotic yogurt while on antibiotic to protect belly.  Any questions?

## 2023-04-16 NOTE — Addendum Note (Signed)
Addended by: Aura Dials T on: 04/16/2023 05:05 PM   Modules accepted: Orders

## 2023-04-16 NOTE — Assessment & Plan Note (Signed)
Noted on past labs.  Check Vitamin D level today and adjust as needed.

## 2023-04-16 NOTE — Assessment & Plan Note (Signed)
No current medications, monitor closely.  Labs today.

## 2023-04-16 NOTE — Addendum Note (Signed)
Addended by: Aura Dials T on: 04/16/2023 05:03 PM   Modules accepted: Orders

## 2023-04-16 NOTE — Addendum Note (Signed)
Addended by: Aura Dials T on: 04/16/2023 02:43 PM   Modules accepted: Orders

## 2023-04-16 NOTE — Assessment & Plan Note (Signed)
During tax season due to job, currently stable.  No medications.  Denies SI/HI.

## 2023-04-16 NOTE — Progress Notes (Signed)
BP (!) 89/57   Pulse (!) 52   Temp 98.3 F (36.8 C) (Oral)   Ht 5\' 6"  (1.676 m)   Wt 163 lb 12.8 oz (74.3 kg)   LMP 05/15/2020 (Approximate)   SpO2 98%   BMI 26.44 kg/m    Subjective:    Patient ID: Sydney Horne, female    DOB: July 05, 1965, 57 y.o.   MRN: [id number]  HPI: Sydney Horne is a 57 y.o. female presenting on 04/16/2023 for comprehensive medical examination. Current medical complaints include:none  She currently lives with: husband Menopausal Symptoms: no  Last A1c 5.7% one year ago.  She is checking sugars at home fasting in morning -- range in 90 to 110.  Has two sisters who are diabetic.  Continues on Vitamin D at home.  The ASCVD Risk score (Arnett DK, et al., 2019) failed to calculate for the following reasons:   The valid systolic blood pressure range is 90 to 200 mmHg   URINARY FREQUENCY Notices this more in evenings, but does not notice after goes to bed.  Drinks less fluids in evening. Started 2 months ago, had a yeast infection one month ago. Dysuria:  occasional Urinary frequency: yes Urgency: yes Small volume voids:  occasional Symptom severity: yes Urinary incontinence: no Foul odor: no Hematuria: no Abdominal pain: no Back pain: no Suprapubic pain/pressure: yes Flank pain: no Fever:  no Vomiting: no Status: better/worse/stable Previous urinary tract infection: yes Recurrent urinary tract infection: no Sexual activity: monogamous Treatments attempted: increasing fluids    DEPRESSION Not using Melatonin as much at present, sleeping better.  Does not wish to start medication at this time. Mood status: stable Psychotherapy/counseling: none Depressed mood: no Anxious mood: no Anhedonia: yes Significant weight loss or gain: no Insomnia: yes hard to fall asleep Fatigue: yes Feelings of worthlessness or guilt: no Impaired concentration/indecisiveness: no Suicidal ideations: no Hopelessness: no Crying spells: no    04/16/2023    2:04 PM  03/14/2022    8:52 AM 03/14/2021   10:24 AM 03/18/2020    8:04 AM 01/22/2019    8:29 AM  Depression screen PHQ 2/9  Decreased Interest 0 0 0 0 0  Down, Depressed, Hopeless 0 0 0 0 0  PHQ - 2 Score 0 0 0 0 0  Altered sleeping 1 2 3  1   Tired, decreased energy 0 2 0  0  Change in appetite 1 0 0  1  Feeling bad or failure about yourself  0 0 0  0  Trouble concentrating 1 2 0  0  Moving slowly or fidgety/restless 0 0 0  0  Suicidal thoughts 0 0 0  0  PHQ-9 Score 3 6 3  2   Difficult doing work/chores Not difficult at all Somewhat difficult Not difficult at all  Not difficult at all       04/16/2023    2:04 PM 03/14/2022    8:52 AM 01/22/2019    8:29 AM  GAD 7 : Generalized Anxiety Score  Nervous, Anxious, on Edge 0 0 0  Control/stop worrying 0 0 0  Worry too much - different things 0 1 0  Trouble relaxing 1 1 0  Restless 1 0 0  Easily annoyed or irritable 0 0 0  Afraid - awful might happen 0 0 0  Total GAD 7 Score 2 2 0  Anxiety Difficulty Not difficult at all Not difficult at all Not difficult at all       03/14/2021  10:42 AM 07/07/2021    8:03 AM 08/16/2021    4:41 PM 03/14/2022    8:52 AM 04/16/2023    2:04 PM  Fall Risk  Falls in the past year? 0 0 0 0 0  Was there an injury with Fall? 0 0 0 0 0  Fall Risk Category Calculator 0 0 0 0 0  Fall Risk Category (Retired) Low Low Low Low   (RETIRED) Patient Fall Risk Level Low fall risk   Low fall risk   Patient at Risk for Falls Due to No Fall Risks   No Fall Risks No Fall Risks  Fall risk Follow up Falls prevention discussed   Falls evaluation completed Falls evaluation completed    Functional Status Survey: Is the patient deaf or have difficulty hearing?: No Does the patient have difficulty seeing, even when wearing glasses/contacts?: No Does the patient have difficulty concentrating, remembering, or making decisions?: No Does the patient have difficulty walking or climbing stairs?: No Does the patient have difficulty  dressing or bathing?: No Does the patient have difficulty doing errands alone such as visiting a doctor's office or shopping?: No   Past Medical History:  Past Medical History:  Diagnosis Date   Anxiety Last 18 months   Minor, perimenopausal symptom?   Arthritis Approx fall 2018   Minor, thumbs, Dx when treated for gamekeepers thumb.   Environmental allergies    Headache    2x/mo   Heart murmur    followed by PCP   History of kidney stones    Neck pain    sees chiropractor for adjustment when having problems   Pre-diabetes    Vertigo 2017   2 episodes    Surgical History:  Past Surgical History:  Procedure Laterality Date   bladder control surgery     TVT, performed in 2015   COLONOSCOPY WITH PROPOFOL N/A 11/27/2016   Procedure: COLONOSCOPY WITH PROPOFOL;  Surgeon: Midge Minium, MD;  Location: [facility];  Service: Endoscopy;  Laterality: N/A;   DILATATION & CURETTAGE/HYSTEROSCOPY WITH MYOSURE N/A 08/05/2021   Procedure: DILATATION & CURETTAGE/HYSTEROSCOPY WITH MYOSURE; ENDOMETRIAL POLYPECTOMY;  Surgeon: Hildred Laser, MD;  Location: [facility];  Service: Gynecology;  Laterality: N/A;    Medications:  Current Outpatient Medications on File Prior to Visit  Medication Sig   Blood Glucose Monitoring Suppl (ONETOUCH VERIO) w/Device KIT Use to check blood sugar twice a day, fasting in morning and 2 hours after a meal.  Goals: < 130 fasting in morning and <180 two hours after your eat.   Cranberry (THERACRAN ONE PO) Take 1 tablet by mouth daily.   estradiol (ESTRACE) 0.1 MG/GM vaginal cream INSERT 1 APPLICATORFUL VAGINALLY TWICE WEEKLY   glucose blood (CONTOUR NEXT TEST) test strip USE TO CHECK BLOOD SUGAR TWICE A DAY, FASTING IN MORNING AND 2  HOURS AFTER A MEAL. GOALS: &lt; 130 FASTING IN MORNING AND &lt;180 TWO  HOURS AFTER YOUR EAT.   ibuprofen (ADVIL,MOTRIN) 200 MG tablet Take 400 mg by mouth every 6 (six) hours as needed for headache.   Lancets (ONETOUCH ULTRASOFT)  lancets Use to check blood sugar twice a day, fasting in morning and 2 hours after a meal.  Goals: < 130 fasting in morning and <180 two hours after your eat.   Melatonin 5 MG CAPS Take 5 mg by mouth at bedtime as needed (sleep).   Multiple Vitamin (MULTIVITAMIN) tablet Take 1 tablet by mouth daily.   omega-3 acid ethyl esters (LOVAZA) 1 g capsule Take  by mouth 2 (two) times daily.   VITAMIN D PO Take 1 capsule by mouth daily.   No current facility-administered medications on file prior to visit.    Allergies:  Allergies  Allergen Reactions   Other Nausea And Vomiting    sauerkraut   Zithromax [Azithromycin] Diarrhea        Adhesive [Tape] Rash    Band-aids irritate skin    Social History:  Social History   Socioeconomic History   Marital status: Married    Spouse name: Not on file   Number of children: Not on file   Years of education: Not on file   Highest education level: Bachelor's degree (e.g., BA, AB, BS)  Occupational History   Not on file  Tobacco Use   Smoking status: Never   Smokeless tobacco: Never  Vaping Use   Vaping status: Never Used  Substance and Sexual Activity   Alcohol use: Yes    Comment: 6 per year   Drug use: No   Sexual activity: Yes    Birth control/protection: Post-menopausal, None  Other Topics Concern   Not on file  Social History Narrative   Not on file   Social Determinants of Health   Financial Resource Strain: Low Risk  (04/15/2023)   Overall Financial Resource Strain (CARDIA)    Difficulty of Paying Living Expenses: Not hard at all  Food Insecurity: No Food Insecurity (04/15/2023)   Hunger Vital Sign    Worried About Running Out of Food in the Last Year: Never true    Ran Out of Food in the Last Year: Never true  Transportation Needs: No Transportation Needs (04/15/2023)   PRAPARE - Administrator, Civil Service (Medical): No    Lack of Transportation (Non-Medical): No  Physical Activity: Sufficiently Active  (04/15/2023)   Exercise Vital Sign    Days of Exercise per Week: 5 days    Minutes of Exercise per Session: 30 min  Stress: Stress Concern Present (04/15/2023)   [employer] of Occupational Health - Occupational Stress Questionnaire    Feeling of Stress : To some extent  Social Connections: Moderately Integrated (04/15/2023)   Social Connection and Isolation Panel [NHANES]    Frequency of Communication with Friends and Family: Once a week    Frequency of Social Gatherings with Friends and Family: Once a week    Attends Religious Services: More than 4 times per year    Active Member of [employer] or Organizations: Yes    Attends Engineer, structural: More than 4 times per year    Marital Status: Married  Catering manager Violence: Not At Risk (03/14/2021)   Humiliation, Afraid, Rape, and Kick questionnaire    Fear of Current or Ex-Partner: No    Emotionally Abused: No    Physically Abused: No    Sexually Abused: No   Social History   Tobacco Use  Smoking Status Never  Smokeless Tobacco Never   Social History   Substance and Sexual Activity  Alcohol Use Yes   Comment: 6 per year    Family History:  Family History  Problem Relation Age of Onset   Breast cancer Mother 41   Cancer Mother        breast   Hearing loss Mother    Cancer Father        prostate   Breast cancer Sister 105   Cancer Sister        breast   Diabetes Sister  Colon cancer Sister    Obesity Sister    Breast cancer Maternal Aunt    Breast cancer Paternal Aunt    Diabetes Paternal Aunt    Breast cancer Paternal Grandmother    Cancer Paternal Grandmother        breast   Diabetes Maternal Grandmother    Heart attack Maternal Grandfather    Diabetes Sister    Cancer Sister    Obesity Sister    Ovarian cancer Neg Hx     Past medical history, surgical history, medications, allergies, family history and social history reviewed with patient today and changes made to appropriate areas of  the chart.   Review of Systems - negative All other ROS negative except what is listed above and in the HPI.      Objective:    BP (!) 89/57   Pulse (!) 52   Temp 98.3 F (36.8 C) (Oral)   Ht 5\' 6"  (1.676 m)   Wt 163 lb 12.8 oz (74.3 kg)   LMP 05/15/2020 (Approximate)   SpO2 98%   BMI 26.44 kg/m   Wt Readings from Last 3 Encounters:  04/16/23 163 lb 12.8 oz (74.3 kg)  05/10/22 162 lb (73.5 kg)  03/14/22 154 lb 4.8 oz (70 kg)    Physical Exam Vitals and nursing note reviewed.  Constitutional:      General: She is awake. She is not in acute distress.    Appearance: She is well-developed, well-groomed and overweight. She is not ill-appearing.  HENT:     Head: Normocephalic.     Right Ear: Hearing, tympanic membrane, ear canal and external ear normal. No drainage.     Left Ear: Hearing, tympanic membrane, ear canal and external ear normal. No drainage.     Nose: Nose normal.     Mouth/Throat:     Mouth: Mucous membranes are moist.     Pharynx: Uvula midline.  Eyes:     General: Lids are normal.        Right eye: No discharge.        Left eye: No discharge.     Extraocular Movements: Extraocular movements intact.     Conjunctiva/sclera: Conjunctivae normal.     Pupils: Pupils are equal, round, and reactive to light.     Visual Fields: Right eye visual fields normal and left eye visual fields normal.  Neck:     Thyroid: No thyromegaly.     Vascular: No carotid bruit.  Cardiovascular:     Rate and Rhythm: Normal rate and regular rhythm.     Heart sounds: Normal heart sounds. No murmur heard.    No gallop.  Pulmonary:     Effort: Pulmonary effort is normal. No accessory muscle usage or respiratory distress.     Breath sounds: Normal breath sounds.  Chest:  Breasts:    Right: Normal.     Left: Normal.  Abdominal:     General: Bowel sounds are normal.     Palpations: Abdomen is soft. There is no hepatomegaly or splenomegaly.     Tenderness: There is no abdominal  tenderness. There is no right CVA tenderness or left CVA tenderness.  Musculoskeletal:     Cervical back: Normal range of motion and neck supple.     Right lower leg: No edema.     Left lower leg: No edema.  Lymphadenopathy:     Head:     Right side of head: No submental, submandibular, tonsillar, preauricular or posterior auricular adenopathy.  Left side of head: No submental, submandibular, tonsillar, preauricular or posterior auricular adenopathy.     Cervical: No cervical adenopathy.     Upper Body:     Right upper body: No supraclavicular, axillary or pectoral adenopathy.     Left upper body: No supraclavicular, axillary or pectoral adenopathy.  Skin:    General: Skin is warm and dry.  Neurological:     Mental Status: She is alert and oriented to person, place, and time.     Gait: Gait is intact.     Deep Tendon Reflexes: Reflexes are normal and symmetric.     Reflex Scores:      Brachioradialis reflexes are 2+ on the right side and 2+ on the left side.      Patellar reflexes are 2+ on the right side and 2+ on the left side. Psychiatric:        Attention and Perception: Attention normal.        Mood and Affect: Mood normal.        Speech: Speech normal.        Behavior: Behavior normal. Behavior is cooperative.        Thought Content: Thought content normal.        Judgment: Judgment normal.    Results for orders placed or performed in visit on 05/10/22  IGP, Aptima HPV  Result Value Ref Range   Interpretation NILM    Category NIL    Adequacy ENDO    Clinician Provided ICD10 Comment    Performed by: Comment    Note: Comment    Test Methodology Comment    HPV Aptima Negative Negative      Assessment & Plan:   Problem List Items Addressed This Visit       Cardiovascular and Mediastinum   Migraines    No current medications, monitor closely.  Labs today.      Relevant Orders   CBC with Differential/Platelet     Other   Overweight    BMI 26.44.   Recommended eating smaller high protein, low fat meals more frequently and exercising 30 mins a day 5 times a week with a goal of 10-15lb weight loss in the next 3 months. Patient voiced their understanding and motivation to adhere to these recommendations.       Prediabetes    A1c check today, A1c 5.5% today -- improved.  Recommend continue diet and exercise focus.        Relevant Orders   Bayer DCA Hb A1c Waived   Situational depression - Primary    During tax season due to job, currently stable.  No medications.  Denies SI/HI.      Relevant Orders   TSH   Vitamin D deficiency    Noted on past labs.  Check Vitamin D level today and adjust as needed.      Relevant Orders   VITAMIN D 25 Hydroxy (Vit-D Deficiency, Fractures)   Other Visit Diagnoses     Urinary urgency       Check wet prep and UA -- treat as needed.  Discussed with patient.  Recommend pelvic floor therapy at home.   Relevant Orders   Urinalysis, Routine w reflex microscopic   Encounter for lipid screening for cardiovascular disease       Lipid panel today.   Relevant Orders   Comprehensive metabolic panel   Lipid Panel w/o Chol/HDL Ratio   Encounter for annual physical exam       Annual  physical today with labs and health maintenance reviewed, discussed with patient.        Follow up plan: Return in about 1 year (around 04/15/2024) for Annual Physical.   LABORATORY TESTING:  - Pap smear: up to date  IMMUNIZATIONS:   - Tdap: Tetanus vaccination status reviewed: last tetanus booster within 10 years. - Influenza: Has this in October - Pneumovax: Not applicable - Prevnar: Not applicable - HPV: Not applicable - Zostavax vaccine:Up To Date - Covid: has had 2 of these  SCREENING: -Mammogram: Up to date  - Colonoscopy: Up to date  - Bone Density: Not applicable  -Hearing Test: Not applicable  -Spirometry: Not applicable   PATIENT COUNSELING:   Advised to take 1 mg of folate supplement per day if  capable of pregnancy.   Sexuality: Discussed sexually transmitted diseases, partner selection, use of condoms, avoidance of unintended pregnancy  and contraceptive alternatives.   Advised to avoid cigarette smoking.  I discussed with the patient that most people either abstain from alcohol or drink within safe limits (<=14/week and <=4 drinks/occasion for males, <=7/weeks and <= 3 drinks/occasion for females) and that the risk for alcohol disorders and other health effects rises proportionally with the number of drinks per week and how often a drinker exceeds daily limits.  Discussed cessation/primary prevention of drug use and availability of treatment for abuse.   Diet: Encouraged to adjust caloric intake to maintain  or achieve ideal body weight, to reduce intake of dietary saturated fat and total fat, to limit sodium intake by avoiding high sodium foods and not adding table salt, and to maintain adequate dietary potassium and calcium preferably from fresh fruits, vegetables, and low-fat dairy products.    Stressed the importance of regular exercise  Injury prevention: Discussed safety belts, safety helmets, smoke detector, smoking near bedding or upholstery.   Dental health: Discussed importance of regular tooth brushing, flossing, and dental visits.    NEXT PREVENTATIVE PHYSICAL DUE IN 1 YEAR. Return in about 1 year (around 04/15/2024) for Annual Physical.

## 2023-04-16 NOTE — Assessment & Plan Note (Signed)
BMI 26.44.  Recommended eating smaller high protein, low fat meals more frequently and exercising 30 mins a day 5 times a week with a goal of 10-15lb weight loss in the next 3 months. Patient voiced their understanding and motivation to adhere to these recommendations.

## 2023-04-17 ENCOUNTER — Other Ambulatory Visit: Payer: Self-pay | Admitting: Nurse Practitioner

## 2023-04-17 DIAGNOSIS — R7989 Other specified abnormal findings of blood chemistry: Secondary | ICD-10-CM

## 2023-04-17 LAB — COMPREHENSIVE METABOLIC PANEL
ALT: 17 [IU]/L (ref 0–32)
AST: 20 [IU]/L (ref 0–40)
Albumin: 4.4 g/dL (ref 3.8–4.9)
Alkaline Phosphatase: 73 [IU]/L (ref 44–121)
BUN/Creatinine Ratio: 18 (ref 9–23)
BUN: 23 mg/dL (ref 6–24)
Bilirubin Total: 0.6 mg/dL (ref 0.0–1.2)
CO2: 26 mmol/L (ref 20–29)
Calcium: 10 mg/dL (ref 8.7–10.2)
Chloride: 103 mmol/L (ref 96–106)
Creatinine, Ser: 1.27 mg/dL — ABNORMAL HIGH (ref 0.57–1.00)
Globulin, Total: 2 g/dL (ref 1.5–4.5)
Glucose: 94 mg/dL (ref 70–99)
Potassium: 4.2 mmol/L (ref 3.5–5.2)
Sodium: 142 mmol/L (ref 134–144)
Total Protein: 6.4 g/dL (ref 6.0–8.5)
eGFR: 49 mL/min/{1.73_m2} — ABNORMAL LOW (ref >59)

## 2023-04-17 LAB — CBC WITH DIFFERENTIAL/PLATELET
Basophils Absolute: 0 10*3/uL (ref 0.0–0.2)
Basos: 1 %
EOS (ABSOLUTE): 0 10*3/uL (ref 0.0–0.4)
Eos: 1 %
Hematocrit: 40.3 % (ref 34.0–46.6)
Hemoglobin: 13 g/dL (ref 11.1–15.9)
Immature Grans (Abs): 0 10*3/uL (ref 0.0–0.1)
Immature Granulocytes: 0 %
Lymphocytes Absolute: 1.8 10*3/uL (ref 0.7–3.1)
Lymphs: 25 %
MCH: 30.4 pg (ref 26.6–33.0)
MCHC: 32.3 g/dL (ref 31.5–35.7)
MCV: 94 fL (ref 79–97)
Monocytes Absolute: 0.8 10*3/uL (ref 0.1–0.9)
Monocytes: 11 %
Neutrophils Absolute: 4.6 10*3/uL (ref 1.4–7.0)
Neutrophils: 62 %
Platelets: 173 10*3/uL (ref 150–450)
RBC: 4.28 x10E6/uL (ref 3.77–5.28)
RDW: 13.1 % (ref 11.7–15.4)
WBC: 7.3 10*3/uL (ref 3.4–10.8)

## 2023-04-17 LAB — VITAMIN D 25 HYDROXY (VIT D DEFICIENCY, FRACTURES): Vit D, 25-Hydroxy: 72.3 ng/mL (ref 30.0–100.0)

## 2023-04-17 LAB — TSH: TSH: 2.16 u[IU]/mL (ref 0.450–4.500)

## 2023-04-17 LAB — LIPID PANEL W/O CHOL/HDL RATIO
Cholesterol, Total: 170 mg/dL (ref 100–199)
HDL: 72 mg/dL (ref >39)
LDL Chol Calc (NIH): 82 mg/dL (ref 0–99)
Triglycerides: 85 mg/dL (ref 0–149)
VLDL Cholesterol Cal: 16 mg/dL (ref 5–40)

## 2023-04-17 NOTE — Progress Notes (Signed)
Contacted via MyChart -- needs 2 week lab only visit please    Good afternoon Sydney Horne, your labs have returned and these overall remain stable with exception of some decline in kidney function, eGFR and creatinine.  I would like to recheck this in 2 weeks to see if ongoing or improved.  Please ensure plenty of water intake, even add a little lemon.  If you take any supplements with creatinine stop these.  Any questions? Keep being stellar!!  Thank you for allowing me to participate in your care.  I appreciate you. Kindest regards, Eusebio Blazejewski

## 2023-04-20 ENCOUNTER — Other Ambulatory Visit: Payer: Self-pay | Admitting: Nurse Practitioner

## 2023-04-20 LAB — URINE CULTURE

## 2023-04-20 MED ORDER — NITROFURANTOIN MONOHYD MACRO 100 MG PO CAPS: 100.0000 mg | ORAL_CAPSULE | Freq: Two times a day (BID) | ORAL | 0 refills | Status: AC

## 2023-04-20 NOTE — Progress Notes (Signed)
Contacted via MyChart   Good morning Sydney Horne, your urine result has come back and you do have a urine infection.  I am sending in Macrobid to take for this and if ongoing symptoms let me know.

## 2023-05-01 ENCOUNTER — Other Ambulatory Visit: Payer: 59

## 2023-05-01 DIAGNOSIS — R7989 Other specified abnormal findings of blood chemistry: Secondary | ICD-10-CM

## 2023-05-02 LAB — BASIC METABOLIC PANEL
BUN/Creatinine Ratio: 21 (ref 9–23)
BUN: 18 mg/dL (ref 6–24)
CO2: 24 mmol/L (ref 20–29)
Calcium: 10.2 mg/dL (ref 8.7–10.2)
Chloride: 101 mmol/L (ref 96–106)
Creatinine, Ser: 0.86 mg/dL (ref 0.57–1.00)
Glucose: 97 mg/dL (ref 70–99)
Potassium: 4.4 mmol/L (ref 3.5–5.2)
Sodium: 140 mmol/L (ref 134–144)
eGFR: 78 mL/min/{1.73_m2} (ref >59)

## 2023-05-02 NOTE — Progress Notes (Signed)
Contacted via MyChart   Good morning Sydney Horne, your labs have returned and are overall stable this check.  Great news!!!  Any questions? Keep being amazing!!  Thank you for allowing me to participate in your care.  I appreciate you. Kindest regards, Dennis Hegeman

## 2023-05-10 ENCOUNTER — Encounter: Payer: Self-pay | Admitting: Family Medicine

## 2023-05-10 ENCOUNTER — Ambulatory Visit: Payer: 59 | Admitting: Family Medicine

## 2023-05-10 VITALS — BP 112/67 | HR 87 | Temp 98.6°F | Resp 15 | Wt 163.8 lb

## 2023-05-10 DIAGNOSIS — R300 Dysuria: Secondary | ICD-10-CM

## 2023-05-10 DIAGNOSIS — N760 Acute vaginitis: Secondary | ICD-10-CM | POA: Diagnosis not present

## 2023-05-10 DIAGNOSIS — B9689 Other specified bacterial agents as the cause of diseases classified elsewhere: Secondary | ICD-10-CM

## 2023-05-10 DIAGNOSIS — R8281 Pyuria: Secondary | ICD-10-CM | POA: Diagnosis not present

## 2023-05-10 LAB — URINALYSIS, ROUTINE W REFLEX MICROSCOPIC
Bilirubin, UA: NEGATIVE
Glucose, UA: NEGATIVE
Ketones, UA: NEGATIVE
Nitrite, UA: NEGATIVE
Protein,UA: NEGATIVE
Specific Gravity, UA: 1.02 (ref 1.005–1.030)
Urobilinogen, Ur: 0.2 mg/dL (ref 0.2–1.0)
pH, UA: 6 (ref 5.0–7.5)

## 2023-05-10 LAB — WET PREP FOR TRICH, YEAST, CLUE
Clue Cell Exam: POSITIVE — AB
Trichomonas Exam: NEGATIVE
Yeast Exam: NEGATIVE

## 2023-05-10 LAB — MICROSCOPIC EXAMINATION
Bacteria, UA: NONE SEEN
Epithelial Cells (non renal): NONE SEEN /[HPF] (ref 0–10)

## 2023-05-10 MED ORDER — METRONIDAZOLE 500 MG PO TABS: 500.0000 mg | ORAL_TABLET | Freq: Two times a day (BID) | ORAL | 0 refills | Status: DC

## 2023-05-10 NOTE — Progress Notes (Signed)
BP 112/67 (BP Location: Left Arm, Cuff Size: Normal)   Pulse 87   Temp 98.6 F (37 C) (Oral)   Resp 15   Wt 163 lb 12.8 oz (74.3 kg)   LMP 05/15/2020 (Approximate)   SpO2 99%   BMI 26.44 kg/m    Subjective:    Patient ID: Sydney Horne, female    DOB: Aug 12, 1965, 57 y.o.   MRN: [id number]  HPI: Sydney Horne is a 57 y.o. female  Chief Complaint  Patient presents with   Urinary Tract Infection    Started first in October, treated, doesn't think it fully resolved. Burning, urgency, frequency   Vaginitis    Strange discharge and just uncomfortable, itchy   URINARY SYMPTOMS Duration: about a month and a half Dysuria: yes Urinary frequency: yes Urgency: yes Small volume voids: yes Symptom severity: moderate Urinary incontinence: yes Foul odor: no Hematuria: no Abdominal pain: no Back pain: no Suprapubic pain/pressure: yes Flank pain: no Fever:  no Vomiting: no Relief with cranberry juice: no Relief with pyridium: no Status: worse Previous urinary tract infection: yes Recurrent urinary tract infection: no History of sexually transmitted disease: no Vaginal discharge: yes Treatments attempted: cranberry and increasing fluids    Relevant past medical, surgical, family and social history reviewed and updated as indicated. Interim medical history since our last visit reviewed. Allergies and medications reviewed and updated.  Review of Systems  Constitutional: Negative.   Respiratory: Negative.    Cardiovascular: Negative.   Genitourinary:  Positive for dysuria, frequency, urgency and vaginal discharge. Negative for decreased urine volume, difficulty urinating, dyspareunia, enuresis, flank pain, genital sores, hematuria, menstrual problem, pelvic pain, vaginal bleeding and vaginal pain.  Musculoskeletal: Negative.   Psychiatric/Behavioral: Negative.      Per HPI unless specifically indicated above     Objective:    BP 112/67 (BP Location: Left Arm, Cuff  Size: Normal)   Pulse 87   Temp 98.6 F (37 C) (Oral)   Resp 15   Wt 163 lb 12.8 oz (74.3 kg)   LMP 05/15/2020 (Approximate)   SpO2 99%   BMI 26.44 kg/m   Wt Readings from Last 3 Encounters:  05/10/23 163 lb 12.8 oz (74.3 kg)  04/16/23 163 lb 12.8 oz (74.3 kg)  05/10/22 162 lb (73.5 kg)    Physical Exam Vitals and nursing note reviewed.  Constitutional:      General: She is not in acute distress.    Appearance: Normal appearance. She is well-developed and normal weight.  HENT:     Head: Normocephalic and atraumatic.     Right Ear: Hearing and external ear normal.     Left Ear: Hearing and external ear normal.     Nose: Nose normal.     Mouth/Throat:     Mouth: Mucous membranes are moist.     Pharynx: Oropharynx is clear.  Eyes:     General: Lids are normal. No scleral icterus.       Right eye: No discharge.        Left eye: No discharge.     Conjunctiva/sclera: Conjunctivae normal.  Pulmonary:     Effort: Pulmonary effort is normal. No respiratory distress.  Musculoskeletal:        General: Normal range of motion.  Skin:    Coloration: Skin is not jaundiced or pale.     Findings: No bruising, erythema, lesion or rash.  Neurological:     Mental Status: She is alert. Mental status is at baseline.  She is disoriented.  Psychiatric:        Mood and Affect: Mood normal.        Speech: Speech normal.        Behavior: Behavior normal.        Thought Content: Thought content normal.        Judgment: Judgment normal.     Results for orders placed or performed in visit on 05/01/23  Basic metabolic panel   Collection Time: 05/01/23  8:38 AM  Result Value Ref Range   Glucose 97 70 - 99 mg/dL   BUN 18 6 - 24 mg/dL   Creatinine, Ser 1.61 0.57 - 1.00 mg/dL   eGFR 78 >09 UE/AVW/0.98   BUN/Creatinine Ratio 21 9 - 23   Sodium 140 134 - 144 mmol/L   Potassium 4.4 3.5 - 5.2 mmol/L   Chloride 101 96 - 106 mmol/L   CO2 24 20 - 29 mmol/L   Calcium 10.2 8.7 - 10.2 mg/dL       Assessment & Plan:   Problem List Items Addressed This Visit   None Visit Diagnoses       BV (bacterial vaginosis)    -  Primary   Will treat with flagyl. Call with any concerns or if not getting better.   Relevant Medications   metroNIDAZOLE (FLAGYL) 500 MG tablet   Other Relevant Orders   WET PREP FOR TRICH, YEAST, CLUE   Urinalysis, Routine w reflex microscopic     Dysuria       + leuks- will send for culture. + BV will treat.   Relevant Orders   Urinalysis, Routine w reflex microscopic   WET PREP FOR TRICH, YEAST, CLUE     Pyuria       Will send for culture. Call with any concerns.   Relevant Orders   Urine Culture        Follow up plan: Return if symptoms worsen or fail to improve.

## 2023-05-13 LAB — URINE CULTURE

## 2023-05-14 ENCOUNTER — Encounter: Payer: Self-pay | Admitting: Family Medicine

## 2023-05-18 ENCOUNTER — Telehealth: Payer: Self-pay | Admitting: Nurse Practitioner

## 2023-05-18 MED ORDER — AMOXICILLIN 500 MG PO CAPS: 500.0000 mg | ORAL_CAPSULE | Freq: Two times a day (BID) | ORAL | 0 refills | Status: AC

## 2023-05-18 NOTE — Telephone Encounter (Signed)
 She had some skin bacteria in her urine that doesn't usually need to be treated. How is she feeling?

## 2023-05-18 NOTE — Telephone Encounter (Signed)
 Rx sent to her pharmacy

## 2023-05-18 NOTE — Telephone Encounter (Signed)
 Copied from CRM [id number]. Topic: General - Other >> May 18, 2023  2:05 PM Everette C wrote: Reason for CRM: The patient would like to speak with a member of clinical staff when possible regarding their recent labs/urinalysis   Please contact the patient further when possible

## 2023-05-18 NOTE — Telephone Encounter (Signed)
 Called and spoke to patient. She states that she has been having some burning with urination and has been having to drink a lot more water to flush things out. Wanting to know if she needs another antibiotic.

## 2023-05-18 NOTE — Telephone Encounter (Signed)
 Called and spoke with patient. Patient states that she has not heard about her labs she had done 05/10/23 but noticed her urinalysis results were abnormal. Did not see any result note or phone call regarding these results. Patient wants to know about results and if any medication was going to be sent in for her.

## 2023-05-18 NOTE — Telephone Encounter (Signed)
 Patient notified

## 2023-05-24 ENCOUNTER — Telehealth: Payer: Self-pay

## 2023-05-24 NOTE — Telephone Encounter (Signed)
 Spoke with [facility]. Advised patient needs appointment. Past due for annual.

## 2023-05-24 NOTE — Telephone Encounter (Signed)
 Mychart message sent.

## 2023-05-25 ENCOUNTER — Other Ambulatory Visit: Payer: Self-pay

## 2023-05-25 DIAGNOSIS — N952 Postmenopausal atrophic vaginitis: Secondary | ICD-10-CM

## 2023-05-25 MED ORDER — ESTRADIOL 0.1 MG/GM VA CREA: TOPICAL_CREAM | VAGINAL | 3 refills | Status: DC

## 2023-06-01 ENCOUNTER — Other Ambulatory Visit: Payer: 59

## 2023-06-01 DIAGNOSIS — B9689 Other specified bacterial agents as the cause of diseases classified elsewhere: Secondary | ICD-10-CM

## 2023-06-01 LAB — URINALYSIS, ROUTINE W REFLEX MICROSCOPIC
Bilirubin, UA: NEGATIVE
Glucose, UA: NEGATIVE
Ketones, UA: NEGATIVE
Nitrite, UA: NEGATIVE
Specific Gravity, UA: 1.01 (ref 1.005–1.030)
Urobilinogen, Ur: 0.2 mg/dL (ref 0.2–1.0)
pH, UA: 6.5 (ref 5.0–7.5)

## 2023-06-01 LAB — WET PREP FOR TRICH, YEAST, CLUE
Clue Cell Exam: POSITIVE — AB
Trichomonas Exam: NEGATIVE
Yeast Exam: NEGATIVE

## 2023-06-01 LAB — MICROSCOPIC EXAMINATION: Bacteria, UA: NONE SEEN

## 2023-06-03 ENCOUNTER — Encounter: Payer: Self-pay | Admitting: Family Medicine

## 2023-06-03 ENCOUNTER — Other Ambulatory Visit: Payer: Self-pay | Admitting: Family Medicine

## 2023-06-03 MED ORDER — METRONIDAZOLE 500 MG PO TABS: 500.0000 mg | ORAL_TABLET | Freq: Two times a day (BID) | ORAL | 0 refills | Status: DC

## 2023-06-26 ENCOUNTER — Ambulatory Visit: Payer: 59 | Admitting: Obstetrics and Gynecology

## 2023-06-27 ENCOUNTER — Ambulatory Visit: Payer: 59 | Admitting: Obstetrics and Gynecology

## 2023-06-28 NOTE — Patient Instructions (Signed)
Preventive Care 39-59 Years Old, Female Preventive care refers to lifestyle choices and visits with your health care provider that can promote health and wellness. Preventive care visits are also called wellness exams. What can I expect for my preventive care visit? Counseling Your health care provider may ask you questions about your: Medical history, including: Past medical problems. Family medical history. Pregnancy history. Current health, including: Menstrual cycle. Method of birth control. Emotional well-being. Home life and relationship well-being. Sexual activity and sexual health. Lifestyle, including: Alcohol, nicotine or tobacco, and drug use. Access to firearms. Diet, exercise, and sleep habits. Work and work Astronomer. Sunscreen use. Safety issues such as seatbelt and bike helmet use. Physical exam Your health care provider will check your: Height and weight. These may be used to calculate your BMI (body mass index). BMI is a measurement that tells if you are at a healthy weight. Waist circumference. This measures the distance around your waistline. This measurement also tells if you are at a healthy weight and may help predict your risk of certain diseases, such as type 2 diabetes and high blood pressure. Heart rate and blood pressure. Body temperature. Skin for abnormal spots. What immunizations do I need?  Vaccines are usually given at various ages, according to a schedule. Your health care provider will recommend vaccines for you based on your age, medical history, and lifestyle or other factors, such as travel or where you work. What tests do I need? Screening Your health care provider may recommend screening tests for certain conditions. This may include: Lipid and cholesterol levels. Diabetes screening. This is done by checking your blood sugar (glucose) after you have not eaten for a while (fasting). Pelvic exam and Pap test. Hepatitis B test. Hepatitis C  test. HIV (human immunodeficiency virus) test. STI (sexually transmitted infection) testing, if you are at risk. Lung cancer screening. Colorectal cancer screening. Mammogram. Talk with your health care provider about when you should start having regular mammograms. This may depend on whether you have a family history of breast cancer. BRCA-related cancer screening. This may be done if you have a family history of breast, ovarian, tubal, or peritoneal cancers. Bone density scan. This is done to screen for osteoporosis. Talk with your health care provider about your test results, treatment options, and if necessary, the need for more tests. Follow these instructions at home: Eating and drinking  Eat a diet that includes fresh fruits and vegetables, whole grains, lean protein, and low-fat dairy products. Take vitamin and mineral supplements as recommended by your health care provider. Do not drink alcohol if: Your health care provider tells you not to drink. You are pregnant, may be pregnant, or are planning to become pregnant. If you drink alcohol: Limit how much you have to 0-1 drink a day. Know how much alcohol is in your drink. In the [place] one drink equals one 12 oz bottle of beer (355 mL), one 5 oz glass of wine (148 mL), or one 1 oz glass of hard liquor (44 mL). Lifestyle Brush your teeth every morning and night with fluoride toothpaste. Floss one time each day. Exercise for at least 30 minutes 5 or more days each week. Do not use any products that contain nicotine or tobacco. These products include cigarettes, chewing tobacco, and vaping devices, such as e-cigarettes. If you need help quitting, ask your health care provider. Do not use drugs. If you are sexually active, practice safe sex. Use a condom or other form of protection to  prevent STIs. If you do not wish to become pregnant, use a form of birth control. If you plan to become pregnant, see your health care provider for a  prepregnancy visit. Take aspirin only as told by your health care provider. Make sure that you understand how much to take and what form to take. Work with your health care provider to find out whether it is safe and beneficial for you to take aspirin daily. Find healthy ways to manage stress, such as: Meditation, yoga, or listening to music. Journaling. Talking to a trusted person. Spending time with friends and family. Minimize exposure to UV radiation to reduce your risk of skin cancer. Safety Always wear your seat belt while driving or riding in a vehicle. Do not drive: If you have been drinking alcohol. Do not ride with someone who has been drinking. When you are tired or distracted. While texting. If you have been using any mind-altering substances or drugs. Wear a helmet and other protective equipment during sports activities. If you have firearms in your house, make sure you follow all gun safety procedures. Seek help if you have been physically or sexually abused. What's next? Visit your health care provider once a year for an annual wellness visit. Ask your health care provider how often you should have your eyes and teeth checked. Stay up to date on all vaccines. This information is not intended to replace advice given to you by your health care provider. Make sure you discuss any questions you have with your health care provider. Document Revised: 10/27/2020 Document Reviewed: 10/27/2020 Elsevier Patient Education  2024 Elsevier Inc. Breast Self-Awareness Breast self-awareness is knowing how your breasts look and feel. You need to: Check your breasts on a regular basis. Tell your doctor about any changes. Become familiar with the look and feel of your breasts. This can help you catch a breast problem while it is still small and can be treated. You should do breast self-exams even if you have breast implants. What you need: A mirror. A well-lit room. A pillow or other  soft object. How to do a breast self-exam Follow these steps to do a breast self-exam: Look for changes  Take off all the clothes above your waist. Stand in front of a mirror in a room with good lighting. Put your hands down at your sides. Compare your breasts in the mirror. Look for any difference between them, such as: A difference in shape. A difference in size. Wrinkles, dips, and bumps in one breast and not the other. Look at each breast for changes in the skin, such as: Redness. Scaly areas. Skin that has gotten thicker. Dimpling. Open sores (ulcers). Look for changes in your nipples, such as: Fluid coming out of a nipple. Fluid around a nipple. Bleeding. Dimpling. Redness. A nipple that looks pushed in (retracted), or that has changed position. Feel for changes Lie on your back. Feel each breast. To do this: Pick a breast to feel. Place a pillow under the shoulder closest to that breast. Put the arm closest to that breast behind your head. Feel the nipple area of that breast using the hand of your other arm. Feel the area with the pads of your three middle fingers by making small circles with your fingers. Use light, medium, and firm pressure. Continue the overlapping circles, moving downward over the breast. Keep making circles with your fingers. Stop when you feel your ribs. Start making circles with your fingers again, this time going  upward until you reach your collarbone. Then, make circles outward across your breast and into your armpit area. Squeeze your nipple. Check for discharge and lumps. Repeat these steps to check your other breast. Sit or stand in the tub or shower. With soapy water on your skin, feel each breast the same way you did when you were lying down. Write down what you find Writing down what you find can help you remember what to tell your doctor. Write down: What is normal for each breast. Any changes you find in each breast. These  include: The kind of changes you find. A tender or painful breast. Any lump you find. Write down its size and where it is. When you last had your monthly period (menstrual cycle). General tips If you are breastfeeding, the best time to check your breasts is after you feed your baby or after you use a breast pump. If you get monthly bleeding, the best time to check your breasts is 5-7 days after your monthly cycle ends. With time, you will become comfortable with the self-exam. You will also start to know if there are changes in your breasts. Contact a doctor if: You see a change in the shape or size of your breasts or nipples. You see a change in the skin of your breast or nipples, such as red or scaly skin. You have fluid coming from your nipples that is not normal. You find a new lump or thick area. You have breast pain. You have any concerns about your breast health. Summary Breast self-awareness includes looking for changes in your breasts and feeling for changes within your breasts. You should do breast self-awareness in front of a mirror in a well-lit room. If you get monthly periods (menstrual cycles), the best time to check your breasts is 5-7 days after your period ends. Tell your doctor about any changes you see in your breasts. Changes include changes in size, changes on the skin, painful or tender breasts, or fluid from your nipples that is not normal. This information is not intended to replace advice given to you by your health care provider. Make sure you discuss any questions you have with your health care provider. Document Revised: 10/06/2021 Document Reviewed: 03/03/2021 Elsevier Patient Education  2024 [employer].

## 2023-06-28 NOTE — Progress Notes (Unsigned)
ANNUAL PREVENTATIVE CARE GYNECOLOGY  ENCOUNTER NOTE  Subjective:       Sydney Horne is a 58 y.o. G105P2002 female here for a routine annual gynecologic exam. The patient {is/is not/has never been:13135} sexually active. The patient {is/is not:13135} taking hormone replacement therapy. {post-men bleed:13152::"Patient denies post-menopausal vaginal bleeding."} The patient wears seatbelts: {yes/no:311178}. The patient participates in regular exercise: {yes/no/not asked:9010}. Has the patient ever been transfused or tattooed?: {yes/no/not asked:9010}. The patient reports that there {is/is not:9024} domestic violence in her life.  Current complaints: 1.  ***    Gynecologic History Patient's last menstrual period was 05/15/2020 (approximate). Contraception: post menopausal status Last Pap: 05/10/2022. Results were: normal Last mammogram: 03/22/2023. Results were: normal Last Colonoscopy: 11/27/2016: 10 years Last Dexa Scan: Never Done   Obstetric History OB History  Gravida Para Term Preterm AB Living  2 2 2   2   SAB IAB Ectopic Multiple Live Births      2    # Outcome Date GA Lbr Len/2nd Weight Sex Type Anes PTL Lv  2 Term 1999   8 lb 12.8 oz (3.992 kg) M Vag-Spont   LIV  1 Term 1997   8 lb 1.6 oz (3.674 kg) F Vag-Spont   LIV    Past Medical History:  Diagnosis Date   Anxiety Last 18 months   Minor, perimenopausal symptom?   Arthritis Approx fall 2018   Minor, thumbs, Dx when treated for gamekeepers thumb.   Environmental allergies    Headache    2x/mo   Heart murmur    followed by PCP   History of kidney stones    Neck pain    sees chiropractor for adjustment when having problems   Pre-diabetes    Vertigo 2017   2 episodes    Family History  Problem Relation Age of Onset   Breast cancer Mother 56   Cancer Mother        breast   Hearing loss Mother    Cancer Father        prostate   Breast cancer Sister 18   Cancer Sister        breast   Diabetes Sister     Colon cancer Sister    Obesity Sister    Breast cancer Maternal Aunt    Breast cancer Paternal Aunt    Diabetes Paternal Aunt    Breast cancer Paternal Grandmother    Cancer Paternal Grandmother        breast   Diabetes Maternal Grandmother    Heart attack Maternal Grandfather    Diabetes Sister    Cancer Sister    Obesity Sister    Ovarian cancer Neg Hx     Past Surgical History:  Procedure Laterality Date   bladder control surgery     TVT, performed in 2015   COLONOSCOPY WITH PROPOFOL N/A 11/27/2016   Procedure: COLONOSCOPY WITH PROPOFOL;  Surgeon: Midge Minium, MD;  Location: [facility];  Service: Endoscopy;  Laterality: N/A;   DILATATION & CURETTAGE/HYSTEROSCOPY WITH MYOSURE N/A 08/05/2021   Procedure: DILATATION & CURETTAGE/HYSTEROSCOPY WITH MYOSURE; ENDOMETRIAL POLYPECTOMY;  Surgeon: Hildred Laser, MD;  Location: [facility];  Service: Gynecology;  Laterality: N/A;    Social History   Socioeconomic History   Marital status: Married    Spouse name: Not on file   Number of children: Not on file   Years of education: Not on file   Highest education level: Bachelor's degree (e.g., BA, AB, BS)  Occupational  History   Not on file  Tobacco Use   Smoking status: Never   Smokeless tobacco: Never  Vaping Use   Vaping status: Never Used  Substance and Sexual Activity   Alcohol use: Yes    Comment: 6 per year   Drug use: No   Sexual activity: Yes    Birth control/protection: Post-menopausal, None  Other Topics Concern   Not on file  Social History Narrative   Not on file   Social Drivers of Health   Financial Resource Strain: Low Risk  (04/15/2023)   Overall Financial Resource Strain (CARDIA)    Difficulty of Paying Living Expenses: Not hard at all  Food Insecurity: No Food Insecurity (04/15/2023)   Hunger Vital Sign    Worried About Running Out of Food in the Last Year: Never true    Ran Out of Food in the Last Year: Never true  Transportation Needs: No  Transportation Needs (04/15/2023)   PRAPARE - Administrator, Civil Service (Medical): No    Lack of Transportation (Non-Medical): No  Physical Activity: Sufficiently Active (04/15/2023)   Exercise Vital Sign    Days of Exercise per Week: 5 days    Minutes of Exercise per Session: 30 min  Stress: Stress Concern Present (04/15/2023)   [employer] of Occupational Health - Occupational Stress Questionnaire    Feeling of Stress : To some extent  Social Connections: Moderately Integrated (04/15/2023)   Social Connection and Isolation Panel [NHANES]    Frequency of Communication with Friends and Family: Once a week    Frequency of Social Gatherings with Friends and Family: Once a week    Attends Religious Services: More than 4 times per year    Active Member of [employer] or Organizations: Yes    Attends Engineer, structural: More than 4 times per year    Marital Status: Married  Catering manager Violence: Not At Risk (03/14/2021)   Humiliation, Afraid, Rape, and Kick questionnaire    Fear of Current or Ex-Partner: No    Emotionally Abused: No    Physically Abused: No    Sexually Abused: No    Current Outpatient Medications on File Prior to Visit  Medication Sig Dispense Refill   Blood Glucose Monitoring Suppl (ONETOUCH VERIO) w/Device KIT Use to check blood sugar twice a day, fasting in morning and 2 hours after a meal.  Goals: < 130 fasting in morning and <180 two hours after your eat. 1 kit 0   Cranberry (THERACRAN ONE PO) Take 1 tablet by mouth daily.     estradiol (ESTRACE) 0.1 MG/GM vaginal cream INSERT 1 APPLICATORFUL VAGINALLY TWICE WEEKLY 85 g 3   glucose blood (CONTOUR NEXT TEST) test strip USE TO CHECK BLOOD SUGAR TWICE A DAY, FASTING IN MORNING AND 2  HOURS AFTER A MEAL. GOALS: &lt; 130 FASTING IN MORNING AND &lt;180 TWO  HOURS AFTER YOUR EAT. 100 strip 12   ibuprofen (ADVIL,MOTRIN) 200 MG tablet Take 400 mg by mouth every 6 (six) hours as needed for headache.      Lancets (ONETOUCH ULTRASOFT) lancets Use to check blood sugar twice a day, fasting in morning and 2 hours after a meal.  Goals: < 130 fasting in morning and <180 two hours after your eat. 100 each 12   Melatonin 5 MG CAPS Take 5 mg by mouth at bedtime as needed (sleep).     metroNIDAZOLE (FLAGYL) 500 MG tablet Take 1 tablet (500 mg total) by mouth 2 (two)  times daily. 14 tablet 0   Multiple Vitamin (MULTIVITAMIN) tablet Take 1 tablet by mouth daily.     omega-3 acid ethyl esters (LOVAZA) 1 g capsule Take by mouth 2 (two) times daily.     Probiotic Product (PROBIOTIC DAILY PO) Take 1 each by mouth.     VITAMIN D PO Take 1 capsule by mouth daily.     No current facility-administered medications on file prior to visit.    Allergies  Allergen Reactions   Other Nausea And Vomiting    sauerkraut   Zithromax [Azithromycin] Diarrhea        Adhesive [Tape] Rash    Band-aids irritate skin      Review of Systems ROS Review of Systems - General ROS: negative for - chills, fatigue, fever, hot flashes, night sweats, weight gain or weight loss Psychological ROS: negative for - anxiety, decreased libido, depression, mood swings, physical abuse or sexual abuse Ophthalmic ROS: negative for - blurry vision, eye pain or loss of vision ENT ROS: negative for - headaches, hearing change, visual changes or vocal changes Allergy and Immunology ROS: negative for - hives, itchy/watery eyes or seasonal allergies Hematological and Lymphatic ROS: negative for - bleeding problems, bruising, swollen lymph nodes or weight loss Endocrine ROS: negative for - galactorrhea, hair pattern changes, hot flashes, malaise/lethargy, mood swings, palpitations, polydipsia/polyuria, skin changes, temperature intolerance or unexpected weight changes Breast ROS: negative for - new or changing breast lumps or nipple discharge Respiratory ROS: negative for - cough or shortness of breath Cardiovascular ROS: negative for - chest  pain, irregular heartbeat, palpitations or shortness of breath Gastrointestinal ROS: no abdominal pain, change in bowel habits, or black or bloody stools Genito-Urinary ROS: no dysuria, trouble voiding, or hematuria Musculoskeletal ROS: negative for - joint pain or joint stiffness Neurological ROS: negative for - bowel and bladder control changes Dermatological ROS: negative for rash and skin lesion changes   Objective:   LMP 05/15/2020 (Approximate)  CONSTITUTIONAL: Well-developed, well-nourished female in no acute distress.  PSYCHIATRIC: Normal mood and affect. Normal behavior. Normal judgment and thought content. NEUROLGIC: Alert and oriented to person, place, and time. Normal muscle tone coordination. No cranial nerve deficit noted. HENT:  Normocephalic, atraumatic, External right and left ear normal. Oropharynx is clear and moist EYES: Conjunctivae and EOM are normal. Pupils are equal, round, and reactive to light. No scleral icterus.  NECK: Normal range of motion, supple, no masses.  Normal thyroid.  SKIN: Skin is warm and dry. No rash noted. Not diaphoretic. No erythema. No pallor. CARDIOVASCULAR: Normal heart rate noted, regular rhythm, no murmur. RESPIRATORY: Clear to auscultation bilaterally. Effort and breath sounds normal, no problems with respiration noted. BREASTS: Symmetric in size. No masses, skin changes, nipple drainage, or lymphadenopathy. ABDOMEN: Soft, normal bowel sounds, no distention noted.  No tenderness, rebound or guarding.  BLADDER: Normal PELVIC:  Bladder {:311640}  Urethra: {:311719}  Vulva: {:311722}  Vagina: {:311643}  Cervix: {:311644}  Uterus: {:311718}  Adnexa: {:311645}  RV: {Blank multiple:19196::"External Exam NormaI","No Rectal Masses","Normal Sphincter tone"}  MUSCULOSKELETAL: Normal range of motion. No tenderness.  No cyanosis, clubbing, or edema.  2+ distal pulses. LYMPHATIC: No Axillary, Supraclavicular, or Inguinal  Adenopathy.   Labs: Lab Results  Component Value Date   WBC 7.3 04/16/2023   HGB 13.0 04/16/2023   HCT 40.3 04/16/2023   MCV 94 04/16/2023   PLT 173 04/16/2023    Lab Results  Component Value Date   CREATININE 0.86 05/01/2023   BUN 18 05/01/2023  NA 140 05/01/2023   K 4.4 05/01/2023   CL 101 05/01/2023   CO2 24 05/01/2023    Lab Results  Component Value Date   ALT 17 04/16/2023   AST 20 04/16/2023   ALKPHOS 73 04/16/2023   BILITOT 0.6 04/16/2023    Lab Results  Component Value Date   CHOL 170 04/16/2023   HDL 72 04/16/2023   LDLCALC 82 04/16/2023   TRIG 85 04/16/2023   CHOLHDL 2.7 01/18/2018    Lab Results  Component Value Date   TSH 2.160 04/16/2023    Lab Results  Component Value Date   HGBA1C 5.5 04/16/2023     Assessment:   No diagnosis found.   Plan:  Pap:  UTD Mammogram:  UTD Colon Screening:   UTD Labs:  UTD by PCP Routine preventative health maintenance measures emphasized: {Blank multiple:19196::"Exercise/Diet/Weight control","Tobacco Warnings","Alcohol/Substance use risks","Stress Management","Peer Pressure Issues","Safe Sex"} Flu vaccine status: 02/27/2023 COVID Vaccination status: Return to Clinic - 1 Year   Hildred Laser, MD [facility]

## 2023-06-29 ENCOUNTER — Encounter: Payer: Self-pay | Admitting: Obstetrics and Gynecology

## 2023-06-29 ENCOUNTER — Other Ambulatory Visit (HOSPITAL_COMMUNITY)
Admission: RE | Admit: 2023-06-29 | Discharge: 2023-06-29 | Disposition: A | Discharge status: Home / Self Care | Payer: 59 | Source: Ambulatory Visit | Attending: Obstetrics and Gynecology | Admitting: Obstetrics and Gynecology

## 2023-06-29 ENCOUNTER — Ambulatory Visit (INDEPENDENT_AMBULATORY_CARE_PROVIDER_SITE_OTHER): Payer: 59 | Admitting: Obstetrics and Gynecology

## 2023-06-29 VITALS — BP 115/59 | HR 68 | Ht 66.0 in | Wt 167.1 lb

## 2023-06-29 DIAGNOSIS — Z87440 Personal history of urinary (tract) infections: Secondary | ICD-10-CM

## 2023-06-29 DIAGNOSIS — N760 Acute vaginitis: Secondary | ICD-10-CM

## 2023-06-29 DIAGNOSIS — N898 Other specified noninflammatory disorders of vagina: Secondary | ICD-10-CM | POA: Insufficient documentation

## 2023-06-29 DIAGNOSIS — Z01419 Encounter for gynecological examination (general) (routine) without abnormal findings: Secondary | ICD-10-CM | POA: Diagnosis present

## 2023-06-29 DIAGNOSIS — N390 Urinary tract infection, site not specified: Secondary | ICD-10-CM

## 2023-06-29 DIAGNOSIS — Z803 Family history of malignant neoplasm of breast: Secondary | ICD-10-CM

## 2023-06-29 DIAGNOSIS — N952 Postmenopausal atrophic vaginitis: Secondary | ICD-10-CM

## 2023-06-29 LAB — POCT URINALYSIS DIPSTICK
Bilirubin, UA: NEGATIVE
Glucose, UA: NEGATIVE
Ketones, UA: NEGATIVE
Nitrite, UA: NEGATIVE
Protein, UA: NEGATIVE
Spec Grav, UA: 1.01 (ref 1.010–1.025)
Urobilinogen, UA: 0.2 U/dL
pH, UA: 6 (ref 5.0–8.0)

## 2023-06-29 MED ORDER — SULFAMETHOXAZOLE-TRIMETHOPRIM 400-80 MG PO TABS: 1.0000 | ORAL_TABLET | Freq: Two times a day (BID) | ORAL | 0 refills | Status: DC

## 2023-07-02 LAB — CERVICOVAGINAL ANCILLARY ONLY
Bacterial Vaginitis (gardnerella): NEGATIVE
Candida Glabrata: NEGATIVE
Candida Vaginitis: NEGATIVE
Comment: NEGATIVE
Comment: NEGATIVE
Comment: NEGATIVE

## 2023-07-02 LAB — URINE CULTURE

## 2023-07-03 ENCOUNTER — Other Ambulatory Visit: Payer: Self-pay | Admitting: Obstetrics and Gynecology

## 2023-07-03 ENCOUNTER — Encounter: Payer: Self-pay | Admitting: Obstetrics and Gynecology

## 2023-07-05 ENCOUNTER — Ambulatory Visit: Payer: 59 | Admitting: Obstetrics and Gynecology

## 2023-10-03 ENCOUNTER — Telehealth: Payer: Self-pay

## 2023-10-03 ENCOUNTER — Other Ambulatory Visit: Payer: Self-pay

## 2023-10-03 DIAGNOSIS — N952 Postmenopausal atrophic vaginitis: Secondary | ICD-10-CM

## 2023-10-03 MED ORDER — ESTRADIOL 0.1 MG/GM VA CREA: TOPICAL_CREAM | VAGINAL | 3 refills | Status: AC

## 2023-10-03 NOTE — Telephone Encounter (Signed)
 Sydney Horne

## 2024-01-16 ENCOUNTER — Ambulatory Visit: Admitting: Nurse Practitioner

## 2024-01-16 ENCOUNTER — Encounter: Payer: Self-pay | Admitting: Nurse Practitioner

## 2024-01-16 VITALS — BP 117/76 | HR 50 | Ht 66.0 in | Wt 160.0 lb

## 2024-01-16 DIAGNOSIS — Z87440 Personal history of urinary (tract) infections: Secondary | ICD-10-CM | POA: Diagnosis not present

## 2024-01-16 DIAGNOSIS — M79671 Pain in right foot: Secondary | ICD-10-CM | POA: Insufficient documentation

## 2024-01-16 NOTE — Patient Instructions (Signed)
 Bunion: What to Know A bunion, or hallux valgus, is a bump that forms slowly on the inner side of your big toe joint. It happens when your big toe turns toward your second toe. Bunions may be small at first but get bigger over time. They can make walking painful. What are the causes? A bunion may be caused by: Wearing narrow or pointed shoes that force your big toe to press against the other toes. Problems with how your foot is shaped. Changes in your foot caused by some diseases or conditions. A foot injury. What increases the risk? You're more likely to get a bunion if: You wear shoes that squeeze your toes. You have certain diseases, such as: Rheumatoid arthritis. Cerebral palsy. Someone in your family gets bunions too. You have flat feet or low arches. You do things that put a lot of pressure on your feet, such as ballet. What are the signs or symptoms? The main symptom is a bump on the inner side of your big toe. You may also have: Pain. Redness and swelling around your big toe. Thick or hard skin on your big toe or between your toes. Stiffness or loss of movement in your big toe. Trouble walking. How is this diagnosed? A bunion may be diagnosed based on your symptoms, medical history, and activities.  You may also have tests, such as an X-ray. This helps your health care provider see the bones in your foot and look for damage to your joint. How is this treated? Treatment can help with symptoms and can stop the bunion from getting worse. What you need to do may depend on how bad your symptoms are. You may need to: Wear shoes that have a wide toe box. Use bunion pads to cushion your toes. Tape your toes together. Place an insert called an orthotic device in your shoe. This can help take pressure off your toe joint. Take medicine to help with pain and swelling. Put ice or heat on your foot. Do stretching exercises. Have surgery. You may need this if the bunion is causing very  bad symptoms. Follow these instructions at home: Managing pain, stiffness, and swelling     Use ice or an ice pack as told. Place a towel between your skin and the ice. Leave the ice on for 20 minutes, 2-3 times a day. Use heat as told. Use the heat source that your provider recommends, such as a moist heat pack or a heating pad. Do this as often as told. Place a towel between your skin and the heat source. Leave the heat on for 20-30 minutes. If your skin turns red, take off the ice or heat right away to prevent skin damage. The risk of damage is higher if you can't feel pain, heat, or cold. General instructions Exercise as told. Support your toe joint as told with: The right footwear. Shoe padding. Taping. Wear shoes that have a wide toe box. Avoid wearing tight shoes or shoes with high heels. Take your medicines only as told. Do not smoke, vape, or use nicotine or tobacco. Keep all follow-up visits. Your provider will check if the treatments are working. Contact a health care provider if: Your symptoms get worse. Your symptoms don't get better in 2 weeks. Get help right away if: You have very bad pain and trouble walking. This information is not intended to replace advice given to you by your health care provider. Make sure you discuss any questions you have with your health  care provider. Document Revised: 11/17/2022 Document Reviewed: 11/17/2022 Elsevier Patient Education  2024 [employer].

## 2024-01-16 NOTE — Assessment & Plan Note (Signed)
 Ongoing issue, on multiple supplements and vaginal estrace , but symptoms continue to present if does not take Pyridium consistently.  Will continue collaboration with GYN, but also place a referral to UroGyn for further recommendations.  She was on abx therapy in past daily by urology for prevention, but reports GYN did not recommend this.  ?if related to prolapse or other underlying cause for recurrence.

## 2024-01-16 NOTE — Assessment & Plan Note (Signed)
 Ongoing for one year with worsening.  Bunion noted on exam.  Low suspicion for gout, but will check uric acid level outpatient + obtain imaging.  Recommend OTC treatments to start: Voltaren gel and Tylenol  as needed.  Wear compression sleeve for foot when up moving to offer support.  Looks into alternate foot wear.  If ongoing then will send to podiatry.

## 2024-01-16 NOTE — Progress Notes (Signed)
 BP 117/76   Pulse (!) 50   Ht 5' 6 (1.676 m)   Wt 160 lb (72.6 kg)   LMP 05/15/2020 (Approximate)   SpO2 98%   BMI 25.82 kg/m    Subjective:    Patient ID: Sydney Horne, female    DOB: [birth year], 58 y.o.   MRN: [id number]  HPI: Sydney Horne is a 58 y.o. female  Chief Complaint  Patient presents with   Foot Pain   History of UTI   Has been following with GYN, continues to have issues with UTIs.  Takes multiple supplements without benefit  FOOT PAIN  Has been present for one year, to right foot.  Mostly bothers when kneeling down or has to go to extreme angle.  To base of great toe right foot.  Getting worse and stiffness is new. Duration: chronic Involved foot: right Mechanism of injury: unknown Location: as above Onset: gradual  Severity: 1/10 currently and 3/10 at present Quality:  dull, aching, and throbbing Frequency: intermittent Radiation: no Aggravating factors: weight bearing and movement  Alleviating factors: resting  Status: worse Treatments attempted: none  Relief with NSAIDs?:  No NSAIDs Taken Weakness with weight bearing or walking: no Morning stiffness: yes Swelling: yes Redness: no Bruising: no Paresthesias / decreased sensation: no  Fevers:no   Relevant past medical, surgical, family and social history reviewed and updated as indicated. Interim medical history since our last visit reviewed. Allergies and medications reviewed and updated.  Review of Systems  Constitutional:  Negative for activity change, appetite change, diaphoresis, fatigue and fever.  Respiratory:  Negative for cough, chest tightness and shortness of breath.   Cardiovascular:  Negative for chest pain, palpitations and leg swelling.  Gastrointestinal: Negative.   Musculoskeletal:  Positive for arthralgias.  Neurological: Negative.   Psychiatric/Behavioral: Negative.      Per HPI unless specifically indicated above     Objective:    BP 117/76   Pulse (!) 50   Ht  5' 6 (1.676 m)   Wt 160 lb (72.6 kg)   LMP 05/15/2020 (Approximate)   SpO2 98%   BMI 25.82 kg/m   Wt Readings from Last 3 Encounters:  01/16/24 160 lb (72.6 kg)  06/29/23 167 lb 1.6 oz (75.8 kg)  05/10/23 163 lb 12.8 oz (74.3 kg)    Physical Exam Vitals and nursing note reviewed.  Constitutional:      General: She is awake. She is not in acute distress.    Appearance: She is well-developed and well-groomed. She is not ill-appearing or toxic-appearing.  HENT:     Head: Normocephalic.     Right Ear: Hearing and external ear normal.     Left Ear: Hearing and external ear normal.  Eyes:     General: Lids are normal.        Right eye: No discharge.        Left eye: No discharge.     Conjunctiva/sclera: Conjunctivae normal.     Pupils: Pupils are equal, round, and reactive to light.  Neck:     Thyroid: No thyromegaly.     Vascular: No carotid bruit.  Cardiovascular:     Rate and Rhythm: Regular rhythm. Bradycardia present.     Pulses:          Dorsalis pedis pulses are 2+ on the right side and 2+ on the left side.       Posterior tibial pulses are 2+ on the right side and 2+ on the left  side.     Heart sounds: Normal heart sounds. No murmur heard.    No gallop.  Pulmonary:     Effort: Pulmonary effort is normal. No accessory muscle usage or respiratory distress.     Breath sounds: Normal breath sounds.  Abdominal:     General: Bowel sounds are normal. There is no distension.     Palpations: Abdomen is soft.     Tenderness: There is no abdominal tenderness.  Musculoskeletal:     Cervical back: Normal range of motion and neck supple.     Right lower leg: No edema.     Left lower leg: No edema.     Right foot: Normal range of motion.     Left foot: Normal range of motion.  Feet:     Right foot:     Protective Sensation: 10 sites tested.  10 sites sensed.     Left foot:     Protective Sensation: 10 sites tested.  10 sites sensed.     Comments: Bunion noted to right foot >  left foot.  No warmth or tenderness.  Mild edema to lateral aspect right foot by great toe.   Lymphadenopathy:     Cervical: No cervical adenopathy.  Skin:    General: Skin is warm and dry.  Neurological:     Mental Status: She is alert and oriented to person, place, and time.     Deep Tendon Reflexes: Reflexes are normal and symmetric.     Reflex Scores:      Brachioradialis reflexes are 2+ on the right side and 2+ on the left side.      Patellar reflexes are 2+ on the right side and 2+ on the left side. Psychiatric:        Attention and Perception: Attention normal.        Mood and Affect: Mood normal.        Speech: Speech normal.        Behavior: Behavior normal. Behavior is cooperative.        Thought Content: Thought content normal.     Results for orders placed or performed in visit on 06/29/23  POCT Urinalysis Dipstick   Collection Time: 06/29/23  8:57 AM  Result Value Ref Range   Color, UA     Clarity, UA     Glucose, UA Negative Negative   Bilirubin, UA Negative    Ketones, UA Negative    Spec Grav, UA 1.010 1.010 - 1.025   Blood, UA trace    pH, UA 6.0 5.0 - 8.0   Protein, UA Negative Negative   Urobilinogen, UA 0.2 0.2 or 1.0 E.U./dL   Nitrite, UA Negative    Leukocytes, UA Moderate (2+) (A) Negative   Appearance     Odor    Cervicovaginal ancillary only   Collection Time: 06/29/23  9:01 AM  Result Value Ref Range   Bacterial Vaginitis (gardnerella) Negative    Candida Vaginitis Negative    Candida Glabrata Negative    Comment Normal Reference Range Candida Species - Negative    Comment Normal Reference Range Candida Galbrata - Negative    Comment      Normal Reference Range Bacterial Vaginosis - Negative  Urine Culture   Collection Time: 06/29/23  9:12 AM   Specimen: Urine   UR  Result Value Ref Range   Urine Culture, Routine Final report (A)    Organism ID, Bacteria Staphylococcus aureus (A)    Antimicrobial Susceptibility Comment  Assessment & Plan:   Problem List Items Addressed This Visit       Other   Right foot pain - Primary   Ongoing for one year with worsening.  Bunion noted on exam.  Low suspicion for gout, but will check uric acid level outpatient + obtain imaging.  Recommend OTC treatments to start: Voltaren gel and Tylenol  as needed.  Wear compression sleeve for foot when up moving to offer support.  Looks into alternate foot wear.  If ongoing then will send to podiatry.        Relevant Orders   Uric acid   DG Foot Complete Right   History of recurrent UTI (urinary tract infection)   Ongoing issue, on multiple supplements and vaginal estrace , but symptoms continue to present if does not take Pyridium consistently.  Will continue collaboration with GYN, but also place a referral to UroGyn for further recommendations.  She was on abx therapy in past daily by urology for prevention, but reports GYN did not recommend this.  ?if related to prolapse or other underlying cause for recurrence.      Relevant Orders   Ambulatory referral to Urogynecology    Time: 25 minutes, >50% spent counseling/or care coordination   Follow up plan: Return for as scheduled in December for physical and then needs oupatient lab visit.

## 2024-01-18 ENCOUNTER — Other Ambulatory Visit

## 2024-01-18 DIAGNOSIS — M79671 Pain in right foot: Secondary | ICD-10-CM

## 2024-01-19 ENCOUNTER — Ambulatory Visit: Payer: Self-pay | Admitting: Nurse Practitioner

## 2024-01-19 LAB — URIC ACID: Uric Acid: 5.4 mg/dL (ref 3.0–7.2)

## 2024-01-19 NOTE — Progress Notes (Signed)
 Contacted via MyChart  Good morning Sydney Horne, your uric acid level has returned and is normal.  Please ensure to go for imaging as discussed.  Any questions? Keep being stellar!!  Thank you for allowing me to participate in your care.  I appreciate you. Kindest regards, Avielle Imbert

## 2024-02-11 ENCOUNTER — Ambulatory Visit
Admission: RE | Admit: 2024-02-11 | Discharge: 2024-02-11 | Disposition: A | Discharge status: Home / Self Care | Source: Ambulatory Visit | Attending: Nurse Practitioner | Admitting: Nurse Practitioner

## 2024-02-11 ENCOUNTER — Ambulatory Visit
Admission: RE | Admit: 2024-02-11 | Discharge: 2024-02-11 | Disposition: A | Discharge status: Home / Self Care | Attending: Nurse Practitioner | Admitting: Nurse Practitioner

## 2024-02-11 DIAGNOSIS — M79671 Pain in right foot: Secondary | ICD-10-CM | POA: Insufficient documentation

## 2024-02-12 ENCOUNTER — Other Ambulatory Visit: Payer: Self-pay | Admitting: Nurse Practitioner

## 2024-02-12 DIAGNOSIS — Z1231 Encounter for screening mammogram for malignant neoplasm of breast: Secondary | ICD-10-CM

## 2024-02-13 ENCOUNTER — Encounter: Payer: Self-pay | Admitting: Nurse Practitioner

## 2024-02-15 NOTE — Progress Notes (Signed)
 Contacted via MyChart  Good afternoon Sydney Horne, your imaging has returned. There is a spur noted and some mild arthritic changes, which could be causing pain.  I would say if ongoing then we get you into podiatry.  Any questions? Keep being amazing!!  Thank you for allowing me to participate in your care.  I appreciate you. Kindest regards, Chastin Garlitz

## 2024-03-19 ENCOUNTER — Other Ambulatory Visit: Payer: Self-pay | Admitting: Medical Genetics

## 2024-03-21 ENCOUNTER — Other Ambulatory Visit
Admission: RE | Admit: 2024-03-21 | Discharge: 2024-03-21 | Disposition: A | Discharge status: Home / Self Care | Payer: Self-pay | Source: Ambulatory Visit | Attending: Medical Genetics | Admitting: Medical Genetics

## 2024-03-25 ENCOUNTER — Ambulatory Visit
Admission: RE | Admit: 2024-03-25 | Discharge: 2024-03-25 | Disposition: A | Discharge status: Home / Self Care | Source: Ambulatory Visit | Attending: Nurse Practitioner | Admitting: Nurse Practitioner

## 2024-03-25 DIAGNOSIS — Z1231 Encounter for screening mammogram for malignant neoplasm of breast: Secondary | ICD-10-CM | POA: Insufficient documentation

## 2024-03-28 ENCOUNTER — Ambulatory Visit: Payer: Self-pay | Admitting: Nurse Practitioner

## 2024-03-28 NOTE — Progress Notes (Signed)
 Contacted via MyChart   Normal mammogram, may repeat in one year:)

## 2024-04-01 LAB — GENECONNECT MOLECULAR SCREEN: Genetic Analysis Overall Interpretation: NEGATIVE

## 2024-04-12 NOTE — Patient Instructions (Signed)
 Be Involved in Caring For Your Health:  Taking Medications When medications are taken as directed, they can greatly improve your health. But if they are not taken as prescribed, they may not work. In some cases, not taking them correctly can be harmful. To help ensure your treatment remains effective and safe, understand your medications and how to take them. Bring your medications to each visit for review by your provider.  Your lab results, notes, and after visit summary will be available on My Chart. We strongly encourage you to use this feature. If lab results are abnormal the clinic will contact you with the appropriate steps. If the clinic does not contact you assume the results are satisfactory. You can always view your results on My Chart. If you have questions regarding your health or results, please contact the clinic during office hours. You can also ask questions on My Chart.  We at [facility] are grateful that you chose [place] to provide your care. We strive to provide evidence-based and compassionate care and are always looking for feedback. If you get a survey from the clinic please complete this so we can hear your opinions.  Prediabetes: Eating Plan Prediabetes is when your levels of blood sugar, also called glucose, are higher than normal. This can put you at risk for getting type 2 diabetes. When you have prediabetes, making healthy changes can help keep you from getting diabetes. This includes changes in your diet. Work with your health care provider or an expert in healthy eating called a dietitian. They can help you create a healthy eating plan. This plan can help you: Control your blood sugar levels. Improve your cholesterol levels. Manage your blood pressure. What are tips for following this plan? Reading food labels Read food labels to check the amount of fat and sugar in prepackaged foods. Avoid foods that have: Saturated fats. Trans fats. Added sugars. Check  food labels for the amount of salt (sodium). Avoid foods that have more than 300 milligrams (mg) of salt per serving. Limit your salt intake to less than 2,300 mg each day. Shopping Avoid buying pre-made and processed foods. Avoid buying drinks with added sugar. Cooking Cook with olive oil. Do not use: Butter. Lard. Ghee. Bake, broil, grill, steam, or boil foods. Avoid frying. Meal planning  Work with your dietitian to create an eating plan that's right for you. This may include tracking how many calories you take in each day. Use a food diary, notebook, or mobile app to track what you eat at each meal. Consider following a Mediterranean diet. This includes: Eating many servings of fresh fruits and vegetables each day. Eating fish at least twice a week. Eating one serving each day of whole grains, beans, nuts, and seeds. Using olive oil instead of other fats. Limiting alcohol. Limiting red meat. Using nonfat or low-fat dairy products. Consider following a plant-based diet. This means eating mostly: Vegetables and fruit. Grains. Beans. Nuts and seeds. If you have high blood pressure, you may need to limit your salt intake or follow a diet called the DASH eating plan. The DASH eating plan can help lower high blood pressure. Lifestyle Set weight loss goals with help from your health care team. Losing 7% of your body weight is a good goal for most people with prediabetes. Exercise for at least 30 minutes, 5 or more days a week. For support, think about joining a support group or talking with a mental health counselor. Take medicines only as  told. What foods are recommended? Fruits Berries. Bananas. Apples. Oranges. Grapes. Papaya. Mango. Pomegranate. Kiwi. Grapefruit. Cherries. Vegetables Lettuce. Spinach. Peas. Beets. Cauliflower. Cabbage. Broccoli. Carrots. Tomatoes. Squash. Eggplant. Herbs. Peppers. Onions. Cucumbers. Brussels sprouts. Grains Whole grains, such as whole-wheat  or whole-grain breads or pasta. Unsweetened oatmeal. Bulgur. Barley. Quinoa. Brown rice. Corn or whole-wheat flour tortillas or taco shells. Meats and other proteins Seafood. Poultry without skin. Lean cuts of pork and beef. Tofu. Eggs. Nuts. Beans. Dairy Low-fat or fat-free dairy products, such as yogurt, cottage cheese, and cheese. Beverages Water. Tea. Coffee. Sugar-free or diet soda. Seltzer water. Low-fat or nonfat milk. Milk alternatives, such as soy or almond milk. Fats and oils Olive oil. Canola oil. Sunflower oil. Grapeseed oil. Avocado. Walnuts. Sweets and desserts Sugar-free or low-fat pudding. Sugar-free or low-fat ice cream and other frozen treats. Seasonings and condiments Herbs. Salt-free spices. Mustard. Relish. Low-salt, low-sugar ketchup. Low-salt, low-sugar barbecue sauce. Low-fat or fat-free mayonnaise. The items listed above may not be all the foods and drinks you can have. Talk with a dietitian to learn more. What foods are not recommended? Fruits Fruits canned with syrup. Vegetables Canned vegetables. Frozen vegetables with butter or cream sauce. Grains Refined white flour and flour products, such as bread, pasta, snack foods, and cereals. Meats and other proteins Fatty cuts of meat. Poultry with skin. Breaded or fried meat. Processed meats. Dairy Full-fat yogurt, cheese, or milk. Beverages Sweetened drinks, such as iced tea and soda. Fats and oils Butter. Lard. Ghee. Sweets and desserts Baked goods, such as cake, cupcakes, pastries, cookies, and cheesecake. Seasonings and condiments Spice mixes with added salt. Ketchup. Barbecue sauce. Mayonnaise. The items listed above may not be all the foods and drinks you should avoid. Talk with a dietitian to learn more. Where to find more information American Diabetes Association: diabetes.org/food-nutrition This information is not intended to replace advice given to you by your health care provider. Make sure you  discuss any questions you have with your health care provider. Document Revised: 12/03/2022 Document Reviewed: 12/03/2022 Elsevier Patient Education  2024 [employer].

## 2024-04-16 ENCOUNTER — Encounter: Admitting: Nurse Practitioner

## 2024-04-22 ENCOUNTER — Encounter: Payer: Self-pay | Admitting: Nurse Practitioner

## 2024-04-22 ENCOUNTER — Ambulatory Visit: Admitting: Nurse Practitioner

## 2024-04-22 VITALS — BP 97/57 | HR 58 | Temp 98.5°F | Resp 15 | Ht 65.98 in | Wt 168.2 lb

## 2024-04-22 DIAGNOSIS — G43009 Migraine without aura, not intractable, without status migrainosus: Secondary | ICD-10-CM | POA: Diagnosis not present

## 2024-04-22 DIAGNOSIS — Z136 Encounter for screening for cardiovascular disorders: Secondary | ICD-10-CM

## 2024-04-22 DIAGNOSIS — Z23 Encounter for immunization: Secondary | ICD-10-CM

## 2024-04-22 DIAGNOSIS — R7303 Prediabetes: Secondary | ICD-10-CM

## 2024-04-22 DIAGNOSIS — F4321 Adjustment disorder with depressed mood: Secondary | ICD-10-CM

## 2024-04-22 DIAGNOSIS — Z0000 Encounter for general adult medical examination without abnormal findings: Secondary | ICD-10-CM | POA: Diagnosis not present

## 2024-04-22 DIAGNOSIS — E663 Overweight: Secondary | ICD-10-CM | POA: Diagnosis not present

## 2024-04-22 DIAGNOSIS — Z13220 Encounter for screening for lipoid disorders: Secondary | ICD-10-CM | POA: Diagnosis not present

## 2024-04-22 DIAGNOSIS — E559 Vitamin D deficiency, unspecified: Secondary | ICD-10-CM

## 2024-04-22 NOTE — Assessment & Plan Note (Signed)
Noted on past labs.  Check Vitamin D level today and adjust as needed.

## 2024-04-22 NOTE — Assessment & Plan Note (Signed)
 Due to grieving and current stressors.  No medications.  Denies SI/HI.

## 2024-04-22 NOTE — Assessment & Plan Note (Signed)
No current medications, monitor closely.  Labs today.

## 2024-04-22 NOTE — Assessment & Plan Note (Signed)
 A1c check today, A1c 5.5% last check -- improved.  Recommend continue diet and exercise focus.  Continue to monitor sugars at home.

## 2024-04-22 NOTE — Assessment & Plan Note (Signed)
 BMI 27.16.  Recommended eating smaller high protein, low fat meals more frequently and exercising 30 mins a day 5 times a week with a goal of 10-15lb weight loss in the next 3 months. Patient voiced their understanding and motivation to adhere to these recommendations.

## 2024-04-22 NOTE — Progress Notes (Signed)
 BP (!) 97/57 (BP Location: Left Arm, Patient Position: Sitting)   Pulse (!) 58   Temp 98.5 F (36.9 C) (Oral)   Resp 15   Ht 5' 5.98 (1.676 m)   Wt 168 lb 3.2 oz (76.3 kg)   LMP 05/15/2020 (Approximate)   SpO2 99%   BMI 27.16 kg/m    Subjective:    Patient ID: Sydney Horne, female    DOB: 04-23-1966, 58 y.o.   MRN: [id number]  HPI: Sydney Horne is a 58 y.o. female presenting on 04/22/2024 for comprehensive medical examination. Current medical complaints include:none  She currently lives with: husband Menopausal Symptoms: no  Follows with GYN, last visit in February 2025. Takes multivitamin daily for Vitamin D . Has not had migraines in awhile.  Impaired Fasting Glucose Has 2 sisters who are diabetic. She does check fasting sugars which run 100 to 115. HbA1C:  Lab Results  Component Value Date   HGBA1C 5.5 04/16/2023  Duration of elevated blood sugar: chronic Polydipsia: no Polyuria: no Weight change: no Visual disturbance: no Glucose Monitoring: yes    Accucheck frequency: Daily    Fasting glucose:     Post prandial:  Diabetic Education: Completed Family history of diabetes: yes   The 10-year ASCVD risk score (Arnett DK, et al., 2019) is: 1.2%   Values used to calculate the score:     Age: 16 years     Clincally relevant sex: Female     Is Non-Hispanic African American: No     Diabetic: No     Tobacco smoker: No     Systolic Blood Pressure: 97 mmHg     Is BP treated: No     HDL Cholesterol: 72 mg/dL     Total Cholesterol: 170 mg/dL   DEPRESSION Has two sisters, one passed one month ago and the second one was hospitalized recently and will probably move in with her. Mood status: stable Psychotherapy/counseling: none Depressed mood: no Anxious mood: no Anhedonia: no Significant weight loss or gain: no Insomnia: better than she has been in awhile Fatigue: no Feelings of worthlessness or guilt: no Impaired concentration/indecisiveness: no Suicidal  ideations: no Hopelessness: no Crying spells: yes    04/22/2024    1:06 PM 01/16/2024   11:27 AM 05/10/2023    4:04 PM 04/16/2023    2:04 PM 03/14/2022    8:52 AM  Depression screen PHQ 2/9  Decreased Interest 0 0 0 0 0  Down, Depressed, Hopeless 0 0 0 0 0  PHQ - 2 Score 0 0 0 0 0  Altered sleeping 0 3 1 1 2   Tired, decreased energy 0 0 1 0 2  Change in appetite 0 1 1 1  0  Feeling bad or failure about yourself  0 0 0 0 0  Trouble concentrating 0 2 0 1 2  Moving slowly or fidgety/restless 0 0 0 0 0  Suicidal thoughts 0 0 0 0 0  PHQ-9 Score 0 6  3  3  6    Difficult doing work/chores   Not difficult at all Not difficult at all Somewhat difficult     Data saved with a previous flowsheet row definition       04/22/2024    1:06 PM 01/16/2024   11:27 AM 05/10/2023    4:04 PM 04/16/2023    2:04 PM  GAD 7 : Generalized Anxiety Score  Nervous, Anxious, on Edge 3 0 1 0  Control/stop worrying 3 0 0 0  Worry too  much - different things 0 0 0 0  Trouble relaxing 1 1 0 1  Restless 0 0 0 1  Easily annoyed or irritable 0 0 0 0  Afraid - awful might happen 1 0 0 0  Total GAD 7 Score 8 1 1 2   Anxiety Difficulty Not difficult at all  Not difficult at all Not difficult at all      08/16/2021    4:41 PM 03/14/2022    8:52 AM 04/16/2023    2:04 PM 05/10/2023    4:04 PM 04/22/2024    1:06 PM  Fall Risk  Falls in the past year? 0 0 0 0 0  Was there an injury with Fall? 0  0  0  0  0  Fall Risk Category Calculator 0 0 0 0 0  Fall Risk Category (Retired) Low  Low      (RETIRED) Patient Fall Risk Level  Low fall risk      Patient at Risk for Falls Due to  No Fall Risks No Fall Risks No Fall Risks No Fall Risks  Fall risk Follow up  Falls evaluation completed  Falls evaluation completed  Falls evaluation completed     Data saved with a previous flowsheet row definition    Functional Status Survey: Is the patient deaf or have difficulty hearing?: No Does the patient have difficulty seeing, even  when wearing glasses/contacts?: No Does the patient have difficulty concentrating, remembering, or making decisions?: No Does the patient have difficulty walking or climbing stairs?: No Does the patient have difficulty dressing or bathing?: No Does the patient have difficulty doing errands alone such as visiting a doctor's office or shopping?: No   Past Medical History:  Past Medical History:  Diagnosis Date   Anxiety Last 18 months   Minor, perimenopausal symptom?   Arthritis Approx fall 2018   Minor, thumbs, Dx when treated for gamekeepers thumb.   Environmental allergies    Headache    2x/mo   Heart murmur    followed by PCP   History of kidney stones    Neck pain    sees chiropractor for adjustment when having problems   Pre-diabetes    Vertigo 2017   2 episodes    Surgical History:  Past Surgical History:  Procedure Laterality Date   bladder control surgery     TVT, performed in 2015   COLONOSCOPY WITH PROPOFOL  N/A 11/27/2016   Procedure: COLONOSCOPY WITH PROPOFOL ;  Surgeon: Jinny Carmine, MD;  Location: [facility];  Service: Endoscopy;  Laterality: N/A;   DILATATION & CURETTAGE/HYSTEROSCOPY WITH MYOSURE N/A 08/05/2021   Procedure: DILATATION & CURETTAGE/HYSTEROSCOPY WITH MYOSURE; ENDOMETRIAL POLYPECTOMY;  Surgeon: Connell Davies, MD;  Location: [facility];  Service: Gynecology;  Laterality: N/A;    Medications:  Current Outpatient Medications on File Prior to Visit  Medication Sig   Blood Glucose Monitoring Suppl (ONETOUCH VERIO) w/Device KIT Use to check blood sugar twice a day, fasting in morning and 2 hours after a meal.  Goals: < 130 fasting in morning and <180 two hours after your eat.   Cranberry (THERACRAN ONE PO) Take 1 tablet by mouth daily.   estradiol  (ESTRACE ) 0.1 MG/GM vaginal cream INSERT 1 APPLICATORFUL VAGINALLY TWICE WEEKLY   glucose blood (CONTOUR NEXT TEST) test strip USE TO CHECK BLOOD SUGAR TWICE A DAY, FASTING IN MORNING AND 2  HOURS AFTER A  MEAL. GOALS: &lt; 130 FASTING IN MORNING AND &lt;180 TWO  HOURS AFTER YOUR EAT.  ibuprofen (ADVIL,MOTRIN) 200 MG tablet Take 400 mg by mouth every 6 (six) hours as needed for headache.   Lancets (ONETOUCH ULTRASOFT) lancets Use to check blood sugar twice a day, fasting in morning and 2 hours after a meal.  Goals: < 130 fasting in morning and <180 two hours after your eat.   Melatonin 5 MG CAPS Take 5 mg by mouth at bedtime as needed (sleep).   Multiple Vitamin (MULTIVITAMIN) tablet Take 1 tablet by mouth daily.   omega-3 acid ethyl esters (LOVAZA) 1 g capsule Take by mouth 2 (two) times daily.   Probiotic Product (PROBIOTIC DAILY PO) Take 1 each by mouth.   No current facility-administered medications on file prior to visit.    Allergies:  Allergies  Allergen Reactions   Other Nausea And Vomiting    sauerkraut   Zithromax [Azithromycin] Diarrhea        Adhesive [Tape] Rash    Band-aids irritate skin    Social History:  Social History   Socioeconomic History   Marital status: Married    Spouse name: Not on file   Number of children: Not on file   Years of education: Not on file   Highest education level: Bachelor's degree (e.g., BA, AB, BS)  Occupational History   Not on file  Tobacco Use   Smoking status: Never   Smokeless tobacco: Never  Vaping Use   Vaping status: Never Used  Substance and Sexual Activity   Alcohol use: Yes    Comment: 6 per year   Drug use: No   Sexual activity: Yes    Birth control/protection: Post-menopausal, None  Other Topics Concern   Not on file  Social History Narrative   Not on file   Social Drivers of Health   Financial Resource Strain: Low Risk  (04/21/2024)   Overall Financial Resource Strain (CARDIA)    Difficulty of Paying Living Expenses: Not hard at all  Food Insecurity: No Food Insecurity (04/21/2024)   Hunger Vital Sign    Worried About Running Out of Food in the Last Year: Never true    Ran Out of Food in the Last Year:  Never true  Transportation Needs: No Transportation Needs (04/21/2024)   PRAPARE - Administrator, Civil Service (Medical): No    Lack of Transportation (Non-Medical): No  Physical Activity: Sufficiently Active (04/21/2024)   Exercise Vital Sign    Days of Exercise per Week: 5 days    Minutes of Exercise per Session: 30 min  Stress: Stress Concern Present (04/21/2024)   [employer] of Occupational Health - Occupational Stress Questionnaire    Feeling of Stress: To some extent  Social Connections: Socially Integrated (04/21/2024)   Social Connection and Isolation Panel    Frequency of Communication with Friends and Family: Three times a week    Frequency of Social Gatherings with Friends and Family: Once a week    Attends Religious Services: More than 4 times per year    Active Member of [employer] or Organizations: Yes    Attends Engineer, Structural: More than 4 times per year    Marital Status: Married  Catering Manager Violence: Not At Risk (03/14/2021)   Humiliation, Afraid, Rape, and Kick questionnaire    Fear of Current or Ex-Partner: No    Emotionally Abused: No    Physically Abused: No    Sexually Abused: No   Social History   Tobacco Use  Smoking Status Never  Smokeless Tobacco Never  Social History   Substance and Sexual Activity  Alcohol Use Yes   Comment: 6 per year    Family History:  Family History  Problem Relation Age of Onset   Breast cancer Mother 62   Cancer Mother        breast   Hearing loss Mother    Cancer Father        prostate   Breast cancer Sister 69   Cancer Sister        breast   Diabetes Sister    Colon cancer Sister    Obesity Sister    Diabetes Sister    Cancer Sister    Obesity Sister    Breast cancer Maternal Aunt    Breast cancer Paternal Aunt    Diabetes Paternal Aunt    Diabetes Maternal Grandmother    Heart attack Maternal Grandfather    Breast cancer Paternal Grandmother    Cancer Paternal  Grandmother        breast   Breast cancer Niece    Ovarian cancer Neg Hx     Past medical history, surgical history, medications, allergies, family history and social history reviewed with patient today and changes made to appropriate areas of the chart.   Review of Systems - negative All other ROS negative except what is listed above and in the HPI.      Objective:    BP (!) 97/57 (BP Location: Left Arm, Patient Position: Sitting)   Pulse (!) 58   Temp 98.5 F (36.9 C) (Oral)   Resp 15   Ht 5' 5.98 (1.676 m)   Wt 168 lb 3.2 oz (76.3 kg)   LMP 05/15/2020 (Approximate)   SpO2 99%   BMI 27.16 kg/m   Wt Readings from Last 3 Encounters:  04/22/24 168 lb 3.2 oz (76.3 kg)  01/16/24 160 lb (72.6 kg)  06/29/23 167 lb 1.6 oz (75.8 kg)    Physical Exam Vitals and nursing note reviewed.  Constitutional:      General: She is awake. She is not in acute distress.    Appearance: She is well-developed and well-groomed. She is not ill-appearing or toxic-appearing.  HENT:     Head: Normocephalic and atraumatic.     Right Ear: Hearing, tympanic membrane, ear canal and external ear normal. No drainage.     Left Ear: Hearing, tympanic membrane, ear canal and external ear normal. No drainage.     Nose: Nose normal.     Right Sinus: No maxillary sinus tenderness or frontal sinus tenderness.     Left Sinus: No maxillary sinus tenderness or frontal sinus tenderness.     Mouth/Throat:     Mouth: Mucous membranes are moist.     Pharynx: Oropharynx is clear. Uvula midline. No pharyngeal swelling, oropharyngeal exudate or posterior oropharyngeal erythema.  Eyes:     General: Lids are normal.        Right eye: No discharge.        Left eye: No discharge.     Extraocular Movements: Extraocular movements intact.     Conjunctiva/sclera: Conjunctivae normal.     Pupils: Pupils are equal, round, and reactive to light.     Visual Fields: Right eye visual fields normal and left eye visual fields  normal.  Neck:     Thyroid: No thyromegaly.     Vascular: No carotid bruit.     Trachea: Trachea normal.  Cardiovascular:     Rate and Rhythm: Regular rhythm. Bradycardia present.  Heart sounds: Normal heart sounds. No murmur heard.    No gallop.  Pulmonary:     Effort: Pulmonary effort is normal. No accessory muscle usage or respiratory distress.     Breath sounds: Normal breath sounds.  Chest:     Comments: Deferred per patient request, recent mammogram. Abdominal:     General: Bowel sounds are normal.     Palpations: Abdomen is soft. There is no hepatomegaly or splenomegaly.     Tenderness: There is no abdominal tenderness.  Musculoskeletal:        General: Normal range of motion.     Cervical back: Normal range of motion and neck supple.     Right lower leg: No edema.     Left lower leg: No edema.  Lymphadenopathy:     Head:     Right side of head: No submental, submandibular, tonsillar, preauricular or posterior auricular adenopathy.     Left side of head: No submental, submandibular, tonsillar, preauricular or posterior auricular adenopathy.     Cervical: No cervical adenopathy.  Skin:    General: Skin is warm and dry.     Capillary Refill: Capillary refill takes less than 2 seconds.     Findings: No rash.  Neurological:     Mental Status: She is alert and oriented to person, place, and time.     Gait: Gait is intact.     Deep Tendon Reflexes: Reflexes are normal and symmetric.     Reflex Scores:      Brachioradialis reflexes are 2+ on the right side and 2+ on the left side.      Patellar reflexes are 2+ on the right side and 2+ on the left side. Psychiatric:        Attention and Perception: Attention normal.        Mood and Affect: Mood normal.        Speech: Speech normal.        Behavior: Behavior normal. Behavior is cooperative.        Thought Content: Thought content normal.        Judgment: Judgment normal.    Results for orders placed or performed  during the hospital encounter of 03/21/24  [facility] Molecular Screen - Blood ([facility])   Collection Time: 03/21/24 12:48 PM  Result Value Ref Range   Genetic Analysis Overall Interpretation Negative    Genetic Disease Assessed      This is a screening test and does not detect all pathogenic or likely pathogenic variant(s) in the tested genes; diagnostic testing is recommended for individuals with a personal or family history of heart disease or hereditary cancer. [employer] Tier One  Population Screen is a screening test that analyzes 11 genes related to hereditary breast and ovarian cancer (HBOC) syndrome, Lynch syndrome, and familial hypercholesterolemia. This test only reports clinically significant pathogenic and likely  pathogenic variants but does not report variants of uncertain significance (VUS). In addition, analysis of the PMS2 gene excludes exons 11-15, which overlap with a known pseudogene (PMS2CL).    Genetic Analysis Report      No pathogenic or likely pathogenic variants were detected in the genes analyzed by this test.Genetic test results should be interpreted in the context of an individual's personal medical and family history. Alteration to medical management is NOT  recommended based solely on this result. Clinical correlation is advised.Additional Considerations- This is a screening test; individuals may still carry pathogenic or likely pathogenic variant(s) in the tested  genes that are not detected by this test.-  For individuals at risk for these or other related conditions based on factors including personal or family history, diagnostic testing is recommended.- The absence of pathogenic or likely pathogenic variant(s) in the analyzed genes, while reassuring,  does not eliminate the possibility of a hereditary condition; there are other variants and genes associated with heart disease and hereditary cancer that are not included in this test.    Genes Tested See  Notes    Disclaimer See Notes    Sequencing Location See Notes    Interpretation Methods and Limitations See Notes       Assessment & Plan:   Problem List Items Addressed This Visit       Cardiovascular and Mediastinum   Migraines - Primary   No current medications, monitor closely.  Labs today.      Relevant Orders   CBC with Differential/Platelet   Comprehensive metabolic panel with GFR   TSH     Other   Vitamin D  deficiency   Noted on past labs.  Check Vitamin D  level today and adjust as needed.      Relevant Orders   VITAMIN D  25 Hydroxy (Vit-D Deficiency, Fractures)   Situational depression   Due to grieving and current stressors.  No medications.  Denies SI/HI.      Prediabetes   A1c check today, A1c 5.5% last check -- improved.  Recommend continue diet and exercise focus.  Continue to monitor sugars at home.      Relevant Orders   HgB A1c   Overweight   BMI 27.16.  Recommended eating smaller high protein, low fat meals more frequently and exercising 30 mins a day 5 times a week with a goal of 10-15lb weight loss in the next 3 months. Patient voiced their understanding and motivation to adhere to these recommendations.       Other Visit Diagnoses       Encounter for lipid screening for cardiovascular disease       Lipid panel today.   Relevant Orders   Lipid Panel w/o Chol/HDL Ratio     Flu vaccine need       Flu vaccine today, educated patient.   Relevant Orders   Flu vaccine trivalent PF, 6mos and older(Flulaval,Afluria,Fluarix,Fluzone) (Completed)     Encounter for annual physical exam       Annual physical today with labs and health maintenance reviewed, discussed with patient.        Follow up plan: Return in about 1 year (around 04/22/2025) for Annual Physical.   LABORATORY TESTING:  - Pap smear: up to date  IMMUNIZATIONS:   - Tdap: Tetanus vaccination status reviewed: last tetanus booster within 10 years. - Influenza: Provided today -  Pneumovax: Not applicable - Prevnar: Not currently in office - HPV: Not applicable - Zostavax vaccine: Up To Date - Covid: has had 3 of these  SCREENING: -Mammogram: Up to date due next 03/25/25 - Colonoscopy: Up to date  - Bone Density: Not applicable  -Hearing Test: Not applicable  -Spirometry: Not applicable   PATIENT COUNSELING:   Advised to take 1 mg of folate supplement per day if capable of pregnancy.   Sexuality: Discussed sexually transmitted diseases, partner selection, use of condoms, avoidance of unintended pregnancy  and contraceptive alternatives.   Advised to avoid cigarette smoking.  I discussed with the patient that most people either abstain from alcohol or drink within safe limits (<=14/week and <=4 drinks/occasion for  males, <=7/weeks and <= 3 drinks/occasion for females) and that the risk for alcohol disorders and other health effects rises proportionally with the number of drinks per week and how often a drinker exceeds daily limits.  Discussed cessation/primary prevention of drug use and availability of treatment for abuse.   Diet: Encouraged to adjust caloric intake to maintain  or achieve ideal body weight, to reduce intake of dietary saturated fat and total fat, to limit sodium intake by avoiding high sodium foods and not adding table salt, and to maintain adequate dietary potassium and calcium preferably from fresh fruits, vegetables, and low-fat dairy products.    Stressed the importance of regular exercise  Injury prevention: Discussed safety belts, safety helmets, smoke detector, smoking near bedding or upholstery.   Dental health: Discussed importance of regular tooth brushing, flossing, and dental visits.    NEXT PREVENTATIVE PHYSICAL DUE IN 1 YEAR. Return in about 1 year (around 04/22/2025) for Annual Physical.

## 2024-04-23 ENCOUNTER — Ambulatory Visit: Payer: Self-pay | Admitting: Nurse Practitioner

## 2024-04-23 LAB — COMPREHENSIVE METABOLIC PANEL WITH GFR
ALT: 17 IU/L (ref 0–32)
AST: 21 IU/L (ref 0–40)
Albumin: 4.8 g/dL (ref 3.8–4.9)
Alkaline Phosphatase: 80 IU/L (ref 49–135)
BUN/Creatinine Ratio: 22 (ref 9–23)
BUN: 22 mg/dL (ref 6–24)
Bilirubin Total: 0.7 mg/dL (ref 0.0–1.2)
CO2: 24 mmol/L (ref 20–29)
Calcium: 10 mg/dL (ref 8.7–10.2)
Chloride: 101 mmol/L (ref 96–106)
Creatinine, Ser: 1.01 mg/dL — ABNORMAL HIGH (ref 0.57–1.00)
Globulin, Total: 2.2 g/dL (ref 1.5–4.5)
Glucose: 82 mg/dL (ref 70–99)
Potassium: 4.1 mmol/L (ref 3.5–5.2)
Sodium: 141 mmol/L (ref 134–144)
Total Protein: 7 g/dL (ref 6.0–8.5)
eGFR: 64 mL/min/1.73 (ref >59)

## 2024-04-23 LAB — LIPID PANEL W/O CHOL/HDL RATIO
Cholesterol, Total: 181 mg/dL (ref 100–199)
HDL: 79 mg/dL (ref >39)
LDL Chol Calc (NIH): 84 mg/dL (ref 0–99)
Triglycerides: 100 mg/dL (ref 0–149)
VLDL Cholesterol Cal: 18 mg/dL (ref 5–40)

## 2024-04-23 LAB — CBC WITH DIFFERENTIAL/PLATELET
Basophils Absolute: 0 x10E3/uL (ref 0.0–0.2)
Basos: 1 %
EOS (ABSOLUTE): 0 x10E3/uL (ref 0.0–0.4)
Eos: 1 %
Hematocrit: 43.2 % (ref 34.0–46.6)
Hemoglobin: 14.2 g/dL (ref 11.1–15.9)
Immature Grans (Abs): 0 x10E3/uL (ref 0.0–0.1)
Immature Granulocytes: 0 %
Lymphocytes Absolute: 1.7 x10E3/uL (ref 0.7–3.1)
Lymphs: 26 %
MCH: 31 pg (ref 26.6–33.0)
MCHC: 32.9 g/dL (ref 31.5–35.7)
MCV: 94 fL (ref 79–97)
Monocytes Absolute: 0.6 x10E3/uL (ref 0.1–0.9)
Monocytes: 9 %
Neutrophils Absolute: 4.2 x10E3/uL (ref 1.4–7.0)
Neutrophils: 63 %
Platelets: 208 x10E3/uL (ref 150–450)
RBC: 4.58 x10E6/uL (ref 3.77–5.28)
RDW: 13.3 % (ref 11.7–15.4)
WBC: 6.6 x10E3/uL (ref 3.4–10.8)

## 2024-04-23 LAB — VITAMIN D 25 HYDROXY (VIT D DEFICIENCY, FRACTURES): Vit D, 25-Hydroxy: 41 ng/mL (ref 30.0–100.0)

## 2024-04-23 LAB — TSH: TSH: 1.53 u[IU]/mL (ref 0.450–4.500)

## 2024-04-23 LAB — HEMOGLOBIN A1C
Est. average glucose Bld gHb Est-mCnc: 120 mg/dL
Hgb A1c MFr Bld: 5.8 % — ABNORMAL HIGH (ref 4.8–5.6)

## 2024-04-23 NOTE — Progress Notes (Signed)
 Contacted via MyChart  Good morning Sydney Horne, your labs have returned: - Kidney function, creatinine and eGFR, remains stable, as is liver function, AST and ALT.  - A1c shows ongoing prediabetes, but no worsening, remaining in similar level as previous checks. - Remainder of labs look great. No changes needed. Any questions? Keep being amazing!!  Thank you for allowing me to participate in your care.  I appreciate you. Kindest regards, Khyrie Masi

## 2024-04-28 ENCOUNTER — Ambulatory Visit: Admitting: Obstetrics and Gynecology

## 2024-05-22 ENCOUNTER — Ambulatory Visit: Payer: Self-pay | Admitting: Family Medicine

## 2024-05-22 ENCOUNTER — Encounter: Payer: Self-pay | Admitting: Family Medicine

## 2024-05-22 ENCOUNTER — Ambulatory Visit: Admitting: Family Medicine

## 2024-05-22 ENCOUNTER — Ambulatory Visit: Payer: Self-pay | Admitting: *Deleted

## 2024-05-22 VITALS — BP 100/68 | HR 91 | Temp 98.0°F | Ht 65.98 in | Wt 162.0 lb

## 2024-05-22 DIAGNOSIS — R509 Fever, unspecified: Secondary | ICD-10-CM

## 2024-05-22 LAB — POC COVID19 BINAXNOW: SARS Coronavirus 2 Ag: NEGATIVE

## 2024-05-22 LAB — POCT INFLUENZA A/B
Influenza A, POC: NEGATIVE
Influenza B, POC: NEGATIVE

## 2024-05-22 MED ORDER — OSELTAMIVIR PHOSPHATE 75 MG PO CAPS: 75.0000 mg | ORAL_CAPSULE | Freq: Two times a day (BID) | ORAL | 0 refills | Status: AC

## 2024-05-22 NOTE — Progress Notes (Signed)
 "  Acute Office Visit  Subjective:     Patient ID: Sydney Horne, female    DOB: Jul 03, 1965, 59 y.o.   MRN: [id number]  Chief Complaint  Patient presents with   Fever    Fever for the last 24 hrs, diarrhea, and body aches.     HPI Patient is in today for flu like symptoms.  Discussed the use of AI scribe software for clinical note transcription with the patient, who gave verbal consent to proceed.  History of Present Illness Sydney Horne is a 59 year old female who presents with fever, headache, and diarrhea after exposure to influenza.  She began experiencing a headache last night, which quickly escalated into a fever reaching 101.33F. She started taking Tylenol  to manage the fever.  Around 1:30 AM, she developed diarrhea. She mentions recent exposure to influenza, as her brother-in-law tested positive for the flu after she spent approximately four hours together in the ER on Monday night.      Review of Systems  All other systems reviewed and are negative.       Objective:    BP 100/68   Pulse 91   Temp 98 F (36.7 C) (Oral)   Ht 5' 5.98 (1.676 m)   Wt 162 lb (73.5 kg)   LMP 05/15/2020   SpO2 99%   BMI 26.16 kg/m     Physical Exam Vitals and nursing note reviewed.  Constitutional:      Appearance: Normal appearance.  HENT:     Head: Normocephalic.     Right Ear: External ear normal.     Left Ear: External ear normal.  Eyes:     Conjunctiva/sclera: Conjunctivae normal.  Cardiovascular:     Rate and Rhythm: Normal rate.  Pulmonary:     Effort: Pulmonary effort is normal. No respiratory distress.  Abdominal:     Palpations: Abdomen is soft.  Musculoskeletal:        General: Normal range of motion.  Skin:    General: Skin is warm.  Neurological:     Mental Status: She is alert and oriented to person, place, and time.  Psychiatric:        Mood and Affect: Mood normal.     Results for orders placed or performed in visit on 05/22/24  POCT  Influenza A/B  Result Value Ref Range   Influenza A, POC Negative Negative   Influenza B, POC Negative Negative  POC COVID-19 BinaxNow  Result Value Ref Range   SARS Coronavirus 2 Ag Negative Negative        Assessment & Plan:   Problem List Items Addressed This Visit   None Visit Diagnoses       Fever, unspecified fever cause    -  Primary   Relevant Medications   oseltamivir  (TAMIFLU ) 75 MG capsule   Other Relevant Orders   POCT Influenza A/B (Completed)   POC COVID-19 BinaxNow (Completed)       Meds ordered this encounter  Medications   oseltamivir  (TAMIFLU ) 75 MG capsule    Sig: Take 1 capsule (75 mg total) by mouth 2 (two) times daily for 5 days.    Dispense:  10 capsule    Refill:  0   Assessment and Plan Assessment & Plan Acute influenza infection Flu and COVID negative. Symptoms and exposure suggest influenza. Initiated treatment pending test results. - Prescribed Tamiflu , sent to [facility] in [place]. - Advised acetaminophen  alter ibuprofen for fever. - Recommended vitamin C, vitamin D ,  and zinc. - Suggested lemon tea with honey for relief. - Advised dietary changes for diarrhea. - Instructed to monitor for blood in stool, abdominal pain, or persistent fever. - Recommended loperamide if diarrhea worsens.   No follow-ups on file.  [name] K Jaylen Claude, MD   "

## 2024-05-22 NOTE — Telephone Encounter (Signed)
 FYI Only or Action Required?: FYI only for provider: appointment scheduled on 1/8.  Patient was last seen in primary care on 04/22/2024 by Sydney Melanie DASEN, NP.  Called Nurse Triage reporting Influenza.  Symptoms began several days ago.  Interventions attempted: Rest, hydration, or home remedies.  Symptoms are: gradually worsening.  Triage Disposition: See Physician Within 24 Hours  Patient/caregiver understands and will follow disposition?: Yes  Copied from CRM #[id number]. Topic: Clinical - Red Word Triage >> May 22, 2024  8:04 AM Richerd A wrote: [facility] that prompted transfer to Nurse Triage: Patient believes she has the flu body aches,fever,headache started around 5AM Reason for Disposition  Fever present > 3 days (72 hours)  Answer Assessment - Initial Assessment Questions 1. TYPE of EXPOSURE: How were you exposed? (e.g., close contact, not a close contact)     Was with brother with flu at ED 2. DATE of EXPOSURE: When did the exposure occur? (e.g., hour, days, weeks)     Monday pm 3. SYMPTOMS: Do you have any symptoms? (e.g., cough, fever, sore throat, difficulty breathing).     Body ache, headache, chills/fever- 10, cramping and diarrhea 4. HIGH RISK for COMPLICATIONS: Do you have any heart or lung problems? Do you have a weakened immune system? (e.g., CHF, COPD, asthma, HIV positive, chemotherapy, renal failure, diabetes mellitus, sickle cell anemia)     none  Protocols used: Influenza (Flu) Exposure-A-AH

## 2024-07-01 ENCOUNTER — Ambulatory Visit: Admitting: Obstetrics & Gynecology

## 2025-04-24 ENCOUNTER — Encounter: Admitting: Nurse Practitioner
